# Patient Record
Sex: Female | Born: 1980 | Race: White | Hispanic: No | Marital: Married | Smoking: Never smoker
Health system: Southern US, Community
[De-identification: ages and names within clinical notes are randomized; demographics above are authoritative.]

## PROBLEM LIST (undated history)

## (undated) DIAGNOSIS — K529 Noninfective gastroenteritis and colitis, unspecified: Secondary | ICD-10-CM

## (undated) DIAGNOSIS — F419 Anxiety disorder, unspecified: Secondary | ICD-10-CM

## (undated) DIAGNOSIS — G43909 Migraine, unspecified, not intractable, without status migrainosus: Secondary | ICD-10-CM

## (undated) DIAGNOSIS — H6991 Unspecified Eustachian tube disorder, right ear: Secondary | ICD-10-CM

---

## 2008-03-28 ENCOUNTER — Ambulatory Visit: Payer: Self-pay | Admitting: Internal Medicine

## 2008-03-28 DIAGNOSIS — R05 Cough: Secondary | ICD-10-CM

## 2008-03-29 ENCOUNTER — Telehealth: Payer: Self-pay | Admitting: Internal Medicine

## 2008-04-05 ENCOUNTER — Ambulatory Visit: Payer: Self-pay | Admitting: Internal Medicine

## 2008-04-05 ENCOUNTER — Telehealth (INDEPENDENT_AMBULATORY_CARE_PROVIDER_SITE_OTHER): Payer: Self-pay | Admitting: *Deleted

## 2008-04-06 ENCOUNTER — Telehealth (INDEPENDENT_AMBULATORY_CARE_PROVIDER_SITE_OTHER): Payer: Self-pay | Admitting: *Deleted

## 2008-07-13 ENCOUNTER — Encounter: Payer: Self-pay | Admitting: Adult Health

## 2008-07-13 ENCOUNTER — Ambulatory Visit: Payer: Self-pay | Admitting: Internal Medicine

## 2008-07-28 ENCOUNTER — Ambulatory Visit: Payer: Self-pay | Admitting: Internal Medicine

## 2008-07-28 DIAGNOSIS — R079 Chest pain, unspecified: Secondary | ICD-10-CM

## 2008-08-16 ENCOUNTER — Ambulatory Visit: Payer: Self-pay | Admitting: Internal Medicine

## 2009-04-25 ENCOUNTER — Ambulatory Visit: Payer: Self-pay | Admitting: Otolaryngology

## 2012-03-18 HISTORY — PX: DIAGNOSTIC LAPAROSCOPY: SUR761

## 2012-07-18 ENCOUNTER — Ambulatory Visit: Payer: Self-pay

## 2012-11-05 ENCOUNTER — Ambulatory Visit: Payer: Self-pay | Admitting: Obstetrics and Gynecology

## 2012-11-10 ENCOUNTER — Ambulatory Visit: Payer: Self-pay | Admitting: Obstetrics and Gynecology

## 2012-11-12 LAB — PATHOLOGY REPORT

## 2012-11-13 ENCOUNTER — Emergency Department: Payer: Self-pay | Admitting: Emergency Medicine

## 2012-11-13 LAB — CBC WITH DIFFERENTIAL/PLATELET
Eosinophil #: 0.2 10*3/uL (ref 0.0–0.7)
Eosinophil %: 2 %
HCT: 39.2 % (ref 35.0–47.0)
HGB: 13.4 g/dL (ref 12.0–16.0)
Lymphocyte #: 2.8 10*3/uL (ref 1.0–3.6)
Lymphocyte %: 34.6 %
Monocyte #: 0.6 x10 3/mm (ref 0.2–0.9)
RBC: 4.31 10*6/uL (ref 3.80–5.20)
RDW: 12.7 % (ref 11.5–14.5)

## 2012-11-13 LAB — URINALYSIS, COMPLETE
Bilirubin,UR: NEGATIVE
Ketone: NEGATIVE
Nitrite: NEGATIVE
Ph: 8 (ref 4.5–8.0)
RBC,UR: 1 /HPF (ref 0–5)
WBC UR: 12 /HPF (ref 0–5)

## 2012-11-13 LAB — COMPREHENSIVE METABOLIC PANEL
Alkaline Phosphatase: 59 U/L (ref 50–136)
Anion Gap: 5 — ABNORMAL LOW (ref 7–16)
Calcium, Total: 9 mg/dL (ref 8.5–10.1)
Chloride: 108 mmol/L — ABNORMAL HIGH (ref 98–107)
Co2: 28 mmol/L (ref 21–32)
Creatinine: 1.35 mg/dL — ABNORMAL HIGH (ref 0.60–1.30)
EGFR (African American): >60
EGFR (Non-African Amer.): 53 — ABNORMAL LOW
Osmolality: 280 (ref 275–301)
SGOT(AST): 23 U/L (ref 15–37)
Sodium: 141 mmol/L (ref 136–145)
Total Protein: 7.2 g/dL (ref 6.4–8.2)

## 2014-07-08 NOTE — Op Note (Signed)
PATIENT NAME:  Candice Mccall, HOLLAR MR#:  [id number] DATE OF BIRTH:  1981-01-29  DATE OF PROCEDURE:  11/10/2012  PREOPERATIVE DIAGNOSIS:  Chronic pelvic pain.   POSTOPERATIVE DIAGNOSIS:  Chronic pelvic pain.  OPERATION PERFORMED:  Diagnostic laparoscopy and cystoscopy.    ANESTHESIA USED:  General.   PRIMARY SURGEON:  Stoney Bang. Georgianne Fick, M.D.   ESTIMATED BLOOD LOSS:  Minimal.   COMPLICATIONS:  None.   INTRAOPERATIVE FINDINGS:  Normal cystoscopy findings.  Normal ovaries, tubes, uterus appendix and ureters on diagnostic laparoscopy.  Some hypopigmented lesions in the cul-de-sac which appear to be old, probable endometriosis scarring versus some endometriotic implants involving the right uterosacral ligament.   SPECIMENS REMOVED:  Peritoneal biopsies of the right uterosacral ligaments as well as cul-de-sac.   THE PATIENT CONDITION FOLLOWING PROCEDURE:  Stable.   PROCEDURE IN DETAIL:  Risks, benefits and alternatives of the procedure were discussed with the patient prior to be taken to the Operating Room.  The patient was taken to the Operating Room where she was placed under general anesthesia.  She was positioned in the dorsal lithotomy position using Allen stirrups.  The patient was prepped and draped in the usual sterile fashion.  Attention was turned to the patient's pelvis.  A 30 degree cystoscope was used to visualize the patient's bladder, bladder dome, ureters as well as urethra with no obvious abnormalities noted.  Following this, an operative speculum was placed.  The inferior lip of the cervix was grasped with a single-tooth tenaculum.  A Hulka tenaculum is then placed to allow manipulation of the uterus before removing the single-tooth tenaculum and operative speculum.  Attention was turned to the patient's abdomen.  A stab incision was made at the base of the umbilicus.  The entry into the peritoneal cavity was accomplished using direct visualization and an XL trocar.  Pneumoperitoneum was  then established.  A left 5 mm XL trocar was then placed under direct visualization.  Inspection of the pelvis and the abdomen was grossly normal as described above with the exception of some small hypopigmented scarring in the pelvis as well as one area which looked to be endometriosis implanted on the right uterosacral ligament.  These were biopsied.  Following the biopsies, pneumoperitoneum was evacuated.  The trocars were removed.  The port sites were closed using Dermabond.  Sponge, needle and instrument counts were correct x 2.  The patient tolerated the procedure well, was taken to the recovery room in stable condition.      ____________________________ Stoney Bang. Georgianne Fick, MD ams:ea D: 11/11/2012 22:31:55 ET T: 11/11/2012 23:33:34 ET JOB#: [id number]  cc: Stoney Bang. Georgianne Fick, MD, <Dictator> Dorthula Nettles MD ELECTRONICALLY SIGNED 11/17/2012 22:42

## 2015-02-03 ENCOUNTER — Ambulatory Visit
Admission: EM | Admit: 2015-02-03 | Discharge: 2015-02-03 | Disposition: A | Discharge status: Home / Self Care | Payer: BLUE CROSS/BLUE SHIELD

## 2015-02-03 ENCOUNTER — Inpatient Hospital Stay
Admission: EM | Admit: 2015-02-03 | Discharge: 2015-02-08 | DRG: 392 | Disposition: A | Discharge status: Home / Self Care | Payer: BLUE CROSS/BLUE SHIELD | Attending: Internal Medicine | Admitting: Internal Medicine

## 2015-02-03 ENCOUNTER — Encounter: Payer: Self-pay | Admitting: Emergency Medicine

## 2015-02-03 ENCOUNTER — Emergency Department: Payer: BLUE CROSS/BLUE SHIELD

## 2015-02-03 DIAGNOSIS — N941 Unspecified dyspareunia: Secondary | ICD-10-CM | POA: Diagnosis present

## 2015-02-03 DIAGNOSIS — K529 Noninfective gastroenteritis and colitis, unspecified: Secondary | ICD-10-CM | POA: Diagnosis not present

## 2015-02-03 DIAGNOSIS — Z91011 Allergy to milk products: Secondary | ICD-10-CM

## 2015-02-03 DIAGNOSIS — F419 Anxiety disorder, unspecified: Secondary | ICD-10-CM | POA: Diagnosis present

## 2015-02-03 DIAGNOSIS — Z882 Allergy status to sulfonamides status: Secondary | ICD-10-CM

## 2015-02-03 DIAGNOSIS — R630 Anorexia: Secondary | ICD-10-CM | POA: Diagnosis present

## 2015-02-03 DIAGNOSIS — R1031 Right lower quadrant pain: Secondary | ICD-10-CM

## 2015-02-03 DIAGNOSIS — N898 Other specified noninflammatory disorders of vagina: Secondary | ICD-10-CM | POA: Diagnosis present

## 2015-02-03 DIAGNOSIS — Z880 Allergy status to penicillin: Secondary | ICD-10-CM

## 2015-02-03 DIAGNOSIS — K469 Unspecified abdominal hernia without obstruction or gangrene: Secondary | ICD-10-CM | POA: Diagnosis present

## 2015-02-03 DIAGNOSIS — F329 Major depressive disorder, single episode, unspecified: Secondary | ICD-10-CM | POA: Diagnosis present

## 2015-02-03 HISTORY — DX: Noninfective gastroenteritis and colitis, unspecified: K52.9

## 2015-02-03 LAB — URINALYSIS COMPLETE WITH MICROSCOPIC (ARMC ONLY)
BILIRUBIN URINE: NEGATIVE
Bacteria, UA: NONE SEEN
Glucose, UA: NEGATIVE mg/dL
Hgb urine dipstick: NEGATIVE
KETONES UR: NEGATIVE mg/dL
Leukocytes, UA: NEGATIVE
NITRITE: NEGATIVE
PROTEIN: NEGATIVE mg/dL
SPECIFIC GRAVITY, URINE: 1.003 — AB (ref 1.005–1.030)
pH: 7 (ref 5.0–8.0)

## 2015-02-03 LAB — WET PREP, GENITAL
CLUE CELLS WET PREP: NONE SEEN
Sperm: NONE SEEN
Trich, Wet Prep: NONE SEEN
WBC WET PREP: NONE SEEN
YEAST WET PREP: NONE SEEN

## 2015-02-03 LAB — COMPREHENSIVE METABOLIC PANEL
ALK PHOS: 65 U/L (ref 38–126)
ALT: 16 U/L (ref 14–54)
ANION GAP: 8 (ref 5–15)
AST: 21 U/L (ref 15–41)
Albumin: 4.9 g/dL (ref 3.5–5.0)
BILIRUBIN TOTAL: 0.7 mg/dL (ref 0.3–1.2)
BUN: 7 mg/dL (ref 6–20)
CALCIUM: 9.4 mg/dL (ref 8.9–10.3)
CO2: 27 mmol/L (ref 22–32)
CREATININE: 0.72 mg/dL (ref 0.44–1.00)
Chloride: 105 mmol/L (ref 101–111)
GFR calc non Af Amer: >60 mL/min (ref >60)
Glucose, Bld: 105 mg/dL — ABNORMAL HIGH (ref 65–99)
Potassium: 4.1 mmol/L (ref 3.5–5.1)
SODIUM: 140 mmol/L (ref 135–145)
TOTAL PROTEIN: 7.6 g/dL (ref 6.5–8.1)

## 2015-02-03 LAB — CBC
HCT: 41 % (ref 35.0–47.0)
HEMOGLOBIN: 13.5 g/dL (ref 12.0–16.0)
MCH: 30.1 pg (ref 26.0–34.0)
MCHC: 33 g/dL (ref 32.0–36.0)
MCV: 91.1 fL (ref 80.0–100.0)
PLATELETS: 309 10*3/uL (ref 150–440)
RBC: 4.5 MIL/uL (ref 3.80–5.20)
RDW: 13.5 % (ref 11.5–14.5)
WBC: 11.1 10*3/uL — AB (ref 3.6–11.0)

## 2015-02-03 LAB — POCT PREGNANCY, URINE: Preg Test, Ur: NEGATIVE

## 2015-02-03 MED ORDER — ONDANSETRON HCL 4 MG/2ML IJ SOLN
4.0000 mg | Freq: Once | INTRAMUSCULAR | Status: AC
  Administered 2015-02-03: 4 mg via INTRAVENOUS
  Filled 2015-02-03: qty 2

## 2015-02-03 MED ORDER — IOHEXOL 240 MG/ML SOLN: 25.0000 mL | Freq: Once | INTRAMUSCULAR | Status: DC | PRN

## 2015-02-03 MED ORDER — MORPHINE SULFATE (PF) 4 MG/ML IV SOLN
4.0000 mg | Freq: Once | INTRAVENOUS | Status: AC
  Administered 2015-02-03: 4 mg via INTRAVENOUS
  Filled 2015-02-03: qty 1

## 2015-02-03 MED ORDER — ONDANSETRON HCL 4 MG/2ML IJ SOLN
4.0000 mg | Freq: Once | INTRAMUSCULAR | Status: AC | PRN
  Administered 2015-02-03 (×2): 4 mg via INTRAVENOUS

## 2015-02-03 MED ORDER — ONDANSETRON HCL 4 MG/2ML IJ SOLN
INTRAMUSCULAR | Status: AC
  Administered 2015-02-03: 4 mg via INTRAVENOUS
  Filled 2015-02-03: qty 2

## 2015-02-03 MED ORDER — IOHEXOL 240 MG/ML SOLN
25.0000 mL | Freq: Once | INTRAMUSCULAR | Status: AC | PRN
  Administered 2015-02-03: 25 mL via ORAL

## 2015-02-03 MED ORDER — SODIUM CHLORIDE 0.9 % IV BOLUS (SEPSIS)
1000.0000 mL | Freq: Once | INTRAVENOUS | Status: AC
  Administered 2015-02-03: 1000 mL via INTRAVENOUS

## 2015-02-03 MED ORDER — IOHEXOL 300 MG/ML  SOLN
75.0000 mL | Freq: Once | INTRAMUSCULAR | Status: AC | PRN
  Administered 2015-02-03: 75 mL via INTRAVENOUS

## 2015-02-03 NOTE — ED Notes (Signed)
Pt states "they are suspicious i have chron's" pt states has had rlq pain that is getting worse with black stools for "weeks". pts tates was seen at [place] yesterday, received oxycodone which is not helping pain. Last dose oxy at 2000 last pm.

## 2015-02-03 NOTE — ED Provider Notes (Signed)
Briefly talked to patient in triage. Patient states that she has had some right lower abdominal pain for over a week did have a CT scan at that time which showed possible etiology related to Crohn's. She was given medication however her symptoms have worsened. She denies any fever however has noticed some changes in her stool and some difficulty urinating. She is in the office today holding the right side of her abdomen. She is awake and alert and oriented. She is here with a family member. I've asked that the patient go to the ER immediately for further evaluation and treatment and possible re-imaging with CT. Patient is stable at this time to travel with family member to [facility] now.   Paulina Fusi, MD 02/03/15 1902

## 2015-02-03 NOTE — ED Notes (Signed)
Pt ambualted to bathroom at this time with no concerns. Pt tolerated well, no acute distress noted at this time.

## 2015-02-03 NOTE — ED Notes (Signed)
Pelvic cart at bedside, set up at this time, MD Lord made aware. Will let RN know when ready.

## 2015-02-03 NOTE — ED Notes (Addendum)
Pt states that she has lower right abdominal pain for about 1 week, pt states that she had a CT scan recently.  Pt states that she is having difficulty urinating and has black tarry stools. Dr Posey Pronto advised pt to be seen at ED by personal vehicle, Notified charge nurse [place] at [facility].

## 2015-02-03 NOTE — ED Provider Notes (Signed)
[facility] Emergency Department Provider Note   ____________________________________________  Time seen:  I have reviewed the triage vital signs and the triage nursing note.  HISTORY  Chief Complaint Abdominal Pain   Historian Patient  HPI Candice Mccall is a 34 y.o. female who is here for evaluation of abdominal pain which is worse in the right lower quadrant and moderate to severe. Pain started about 1 week ago and has just been getting worse. She has had multiple episodes of intermittent abdominal pain including right lower quadrant pain in the past and is presumed to have possible Crohn's disease. Several months ago she had a colonoscopy that showed ulcers and she has a follow-up with a GI doctor in December. 2 weeks ago with her primary care physician she had a CT scan which showed inflammation of the ileum and she was started on prednisone for presumptive Crohn's disease flare. Patient states she was improved and then about 1 week ago she had new/worsening pain.  She was seen at [facility] ED yesterday where she had blood work that showed an elevated white blood cell count, but no imaging, and was sent home on oxycodone. Patient states that she has had continued worsening/severe lower and right lower quadrant abdominal pain. She has had small amount of clear vaginal discharge. She does have dyspareunia. A year or so ago she was told she had endometriosis, but she hasn't had reevaluation for that since the birth of her child.  She thought she had a fever a couple days ago.    History reviewed. No pertinent past medical history.intermittent recurrent abdominal pain, possible Crohn's disease  Patient Active Problem List   Diagnosis Date Noted  . CHEST PAIN 07/28/2008  . COUGH 03/28/2008    History reviewed. No pertinent past surgical history.  No current outpatient prescriptions on file.prednisone, oxycodone  Allergies Penicillins and Sulfa antibiotics  No  family history on file.  Social History Social History  Substance Use Topics  . Smoking status: Never Smoker   . Smokeless tobacco: Never Used  . Alcohol Use: No    Review of Systems  Constitutional: positive for fever Eyes: Negative for visual changes. ENT: Negative for sore throat. Cardiovascular: Negative for chest pain. Respiratory: Negative for shortness of breath. Gastrointestinal: previously multiple diarrhea episodes per day, now to vomit as per week. They have been dark. Positive for nausea no vomiting. Genitourinary: Negative for dysuria. Musculoskeletal: Negative for back pain. Skin: Negative for rash. Neurological: Negative for headache. 10 point Review of Systems otherwise negative ____________________________________________   PHYSICAL EXAM:  VITAL SIGNS: ED Triage Vitals  Enc Vitals Group     BP 02/03/15 1936 122/74 mmHg     Pulse Rate 02/03/15 1936 72     Resp 02/03/15 1936 20     Temp 02/03/15 1936 98.2 F (36.8 C)     Temp Source 02/03/15 1936 Oral     SpO2 02/03/15 1936 98 %     Weight 02/03/15 1936 124 lb (56.246 kg)     Height 02/03/15 1936 5\' 4"  (1.626 m)     Head Cir --      Peak Flow --      Pain Score 02/03/15 1936 9     Pain Loc --      Pain Edu? --      Excl. in [place]? --      Constitutional: Alert and oriented. Well appearing and in no distress. Eyes: Conjunctivae are normal. PERRL. Normal extraocular movements. ENT  Head: Normocephalic and atraumatic.   Nose: No congestion/rhinnorhea.   Mouth/Throat: Mucous membranes are moist.   Neck: No stridor. Cardiovascular/Chest: Normal rate, regular rhythm.  No murmurs, rubs, or gallops. Respiratory: Normal respiratory effort without tachypnea nor retractions. Breath sounds are clear and equal bilaterally. No wheezes/rales/rhonchi. Gastrointestinal: Soft. No distention.  Moderate tenderness mid abdomen and right lower quadrant and suprapubic. Mild guarding, no rebound.   Genitourinary/rectal:small amount of white discharge, no adnexal mass or cervical motion tenderness Musculoskeletal: Nontender with normal range of motion in all extremities. No joint effusions.  No lower extremity tenderness.  No edema. Neurologic:  Normal speech and language. No gross or focal neurologic deficits are appreciated. Skin:  Skin is warm, dry and intact. No rash noted. Psychiatric: Mood and affect are normal. Speech and behavior are normal. Patient exhibits appropriate insight and judgment.  ____________________________________________   EKG I, Lisa Roca, MD, the attending physician have personally viewed and interpreted all ECGs.  No EKG performed ____________________________________________  LABS (pertinent positives/negatives)  Urine pregnancy test negative Urinalysis negative Comprehensive metabolic panel without significant abnormality White blood count 11.1, hemoglobin 13.5 and platelet count 309 Wet prep negative ____________________________________________  RADIOLOGY All Xrays were viewed by me. Imaging interpreted by Radiologist.  CT abdomen and pelvis with contrast:   FINDINGS: Lower chest: No acute findings.  Hepatobiliary: No masses or other significant abnormality. Gallbladder is unremarkable.  Pancreas: No mass, inflammatory changes, or other significant abnormality.  Spleen: Within normal limits in size and appearance.  Adrenals/Urinary Tract: No masses identified. No evidence of hydronephrosis.  Stomach/Bowel: Moderate stool noted in ascending and transverse portions of colon. A nondilated loop of small bowel is seen within the right lower quadrant which shows central mesenteric soft tissue stranding on image 50/series 2. This raises suspicion for an internal hernia. No evidence of bowel obstruction, ischemia, or other complication.  Vascular/Lymphatic: No pathologically enlarged lymph nodes. No evidence of abdominal aortic  aneurysm.  Reproductive: No mass or other significant abnormality.  Other: None.  Musculoskeletal: No suspicious bone lesions identified.  IMPRESSION: Possible internal small bowel hernia in the right lower quadrant. No evidence of bowel obstruction, ischemia, or other acute findings. __________________________________________  PROCEDURES  Procedure(s) performed: None  Critical Care performed: None  ____________________________________________   ED COURSE / ASSESSMENT AND PLAN  CONSULTATIONS: phone consultation with Dr. Adonis Huguenin, surgery.  Phone consultation with GI Dr. Vira Agar.  Hospitalist for admission.  Pertinent labs & imaging results that were available during my care of the patient were reviewed by me and considered in my medical decision making (see chart for details).  I discussed with the patient that although it is possible this is another episode related to possible Crohn's, given that she was improved/better and then had a new episode which is much worse especially right lower quadrant, I do think it is necessary to pursue CT scan abdomen to rule out appendicitis or other emergencies such as abscess or perforation given the history of fever, worsening abdominal pain, and elevated white blood cell count. We discussed risks versus benefit, and in share decision making chest to proceed.  Radiologist commented possible internal hernia on CT scan. I consulted and discussed the case with surgeon on-call, Dr. Adonis Huguenin, and he does not feel that the findings are reflective of internal small bowel hernia, but that the ileum wall is thickened consistent more with a process such as Crohn's.  Patient has failed outpatient therapy, with persistent severe abdominal pain despite prednisone 1 week and oxycodone at  home. Patient will need admission for treatment and workup.  Dr. Vira Agar recommends cipro, flagyl and continue steroids (prednisone 60mg  daily).  Patient / Family /  Caregiver informed of clinical course, medical decision-making process, and agree with plan.    ___________________________________________   FINAL CLINICAL IMPRESSION(S) / ED DIAGNOSES   Final diagnoses:  RLQ abdominal pain  Colitis       Lisa Roca, MD 02/04/15 [phone]

## 2015-02-04 ENCOUNTER — Encounter: Payer: Self-pay | Admitting: Internal Medicine

## 2015-02-04 DIAGNOSIS — N898 Other specified noninflammatory disorders of vagina: Secondary | ICD-10-CM | POA: Diagnosis present

## 2015-02-04 DIAGNOSIS — K529 Noninfective gastroenteritis and colitis, unspecified: Secondary | ICD-10-CM | POA: Diagnosis present

## 2015-02-04 DIAGNOSIS — N941 Unspecified dyspareunia: Secondary | ICD-10-CM | POA: Diagnosis present

## 2015-02-04 DIAGNOSIS — Z91011 Allergy to milk products: Secondary | ICD-10-CM | POA: Diagnosis not present

## 2015-02-04 DIAGNOSIS — Z882 Allergy status to sulfonamides status: Secondary | ICD-10-CM | POA: Diagnosis not present

## 2015-02-04 DIAGNOSIS — K469 Unspecified abdominal hernia without obstruction or gangrene: Secondary | ICD-10-CM | POA: Diagnosis present

## 2015-02-04 DIAGNOSIS — R630 Anorexia: Secondary | ICD-10-CM | POA: Diagnosis present

## 2015-02-04 DIAGNOSIS — F419 Anxiety disorder, unspecified: Secondary | ICD-10-CM | POA: Diagnosis present

## 2015-02-04 DIAGNOSIS — F329 Major depressive disorder, single episode, unspecified: Secondary | ICD-10-CM | POA: Diagnosis present

## 2015-02-04 DIAGNOSIS — Z880 Allergy status to penicillin: Secondary | ICD-10-CM | POA: Diagnosis not present

## 2015-02-04 LAB — CHLAMYDIA/NGC RT PCR (ARMC ONLY)
CHLAMYDIA TR: NOT DETECTED
N GONORRHOEAE: NOT DETECTED

## 2015-02-04 MED ORDER — MORPHINE SULFATE (PF) 2 MG/ML IV SOLN
2.0000 mg | INTRAVENOUS | Status: DC | PRN
  Administered 2015-02-04 – 2015-02-08 (×17): 2 mg via INTRAVENOUS
  Filled 2015-02-04 (×18): qty 1

## 2015-02-04 MED ORDER — ONDANSETRON HCL 4 MG PO TABS
4.0000 mg | ORAL_TABLET | Freq: Four times a day (QID) | ORAL | Status: DC | PRN
  Administered 2015-02-08: 08:00:00 4 mg via ORAL
  Filled 2015-02-04: qty 1

## 2015-02-04 MED ORDER — HEPARIN SODIUM (PORCINE) 5000 UNIT/ML IJ SOLN: 5000.0000 [IU] | Freq: Three times a day (TID) | INTRAMUSCULAR | Status: DC

## 2015-02-04 MED ORDER — MORPHINE SULFATE (PF) 4 MG/ML IV SOLN
4.0000 mg | INTRAVENOUS | Status: DC | PRN
Start: 2015-02-04 — End: 2015-02-04
  Administered 2015-02-04 (×3): 4 mg via INTRAVENOUS
  Filled 2015-02-04 (×3): qty 1

## 2015-02-04 MED ORDER — METRONIDAZOLE IN NACL 5-0.79 MG/ML-% IV SOLN
500.0000 mg | Freq: Three times a day (TID) | INTRAVENOUS | Status: DC
  Administered 2015-02-04 – 2015-02-07 (×10): 500 mg via INTRAVENOUS
  Filled 2015-02-04 (×15): qty 100

## 2015-02-04 MED ORDER — ONDANSETRON HCL 4 MG/2ML IJ SOLN
4.0000 mg | Freq: Four times a day (QID) | INTRAMUSCULAR | Status: DC | PRN
  Administered 2015-02-05 – 2015-02-08 (×6): 4 mg via INTRAVENOUS
  Filled 2015-02-04 (×6): qty 2

## 2015-02-04 MED ORDER — PREDNISONE 10 MG PO TABS
60.0000 mg | ORAL_TABLET | Freq: Every day | ORAL | Status: DC
  Administered 2015-02-05 – 2015-02-06 (×2): 60 mg via ORAL
  Filled 2015-02-04 (×2): qty 1

## 2015-02-04 MED ORDER — DIPHENHYDRAMINE HCL 25 MG PO CAPS
25.0000 mg | ORAL_CAPSULE | ORAL | Status: DC | PRN
  Administered 2015-02-04: 23:00:00 25 mg via ORAL
  Filled 2015-02-04: qty 1

## 2015-02-04 MED ORDER — CIPROFLOXACIN IN D5W 400 MG/200ML IV SOLN
400.0000 mg | Freq: Two times a day (BID) | INTRAVENOUS | Status: DC
  Administered 2015-02-04 – 2015-02-07 (×6): 400 mg via INTRAVENOUS
  Filled 2015-02-04 (×8): qty 200

## 2015-02-04 MED ORDER — CIPROFLOXACIN IN D5W 200 MG/100ML IV SOLN: 200.0000 mg | Freq: Two times a day (BID) | INTRAVENOUS | Status: DC

## 2015-02-04 MED ORDER — METRONIDAZOLE IN NACL 5-0.79 MG/ML-% IV SOLN
500.0000 mg | Freq: Once | INTRAVENOUS | Status: AC
  Administered 2015-02-04: 500 mg via INTRAVENOUS
  Filled 2015-02-04: qty 100

## 2015-02-04 MED ORDER — MORPHINE SULFATE (PF) 2 MG/ML IV SOLN: 2.0000 mg | INTRAVENOUS | Status: DC | PRN

## 2015-02-04 MED ORDER — ACETAMINOPHEN 325 MG PO TABS: 650.0000 mg | ORAL_TABLET | Freq: Four times a day (QID) | ORAL | Status: DC | PRN

## 2015-02-04 MED ORDER — PANTOPRAZOLE SODIUM 40 MG IV SOLR
40.0000 mg | Freq: Once | INTRAVENOUS | Status: AC
  Administered 2015-02-04: 40 mg via INTRAVENOUS
  Filled 2015-02-04: qty 40

## 2015-02-04 MED ORDER — DULOXETINE HCL 30 MG PO CPEP
30.0000 mg | ORAL_CAPSULE | Freq: Every day | ORAL | Status: DC
  Administered 2015-02-04 – 2015-02-08 (×5): 30 mg via ORAL
  Filled 2015-02-04 (×5): qty 1

## 2015-02-04 MED ORDER — SODIUM CHLORIDE 0.9 % IV SOLN
INTRAVENOUS | Status: DC
  Administered 2015-02-04 – 2015-02-08 (×6): via INTRAVENOUS

## 2015-02-04 MED ORDER — OXYCODONE HCL 5 MG PO TABS
5.0000 mg | ORAL_TABLET | ORAL | Status: DC | PRN
  Filled 2015-02-04 (×2): qty 1

## 2015-02-04 MED ORDER — CIPROFLOXACIN IN D5W 400 MG/200ML IV SOLN
400.0000 mg | Freq: Once | INTRAVENOUS | Status: AC
  Administered 2015-02-04: 400 mg via INTRAVENOUS
  Filled 2015-02-04: qty 200

## 2015-02-04 MED ORDER — ACETAMINOPHEN 650 MG RE SUPP: 650.0000 mg | Freq: Four times a day (QID) | RECTAL | Status: DC | PRN

## 2015-02-04 MED ORDER — METHYLPREDNISOLONE SODIUM SUCC 125 MG IJ SOLR
60.0000 mg | Freq: Once | INTRAMUSCULAR | Status: AC
  Administered 2015-02-04: 60 mg via INTRAVENOUS
  Filled 2015-02-04 (×2): qty 2

## 2015-02-04 NOTE — Consult Note (Signed)
Pt with hx of RLQ abd pain with several studies and multiple visits for this over the last 3 years.  She has not had a capsule endoscopy and we will try to arrange that for Monday, if not then can be done as an out patient.

## 2015-02-04 NOTE — Consult Note (Signed)
GI Inpatient Consult Note  Reason for Consult: Colitis   Attending Requesting Consult: Dr. Lavetta Nielsen  History of Present Illness: Candice Mccall is a 34 y.o. female who reports that she has had abdominal pain and diarrhea for several years.  She reports she will have 3-10 episodes a day, and then will occasionally have a week of constipation, followed by diarrhea episodes again.  She reports her abdominal pain as right lower quadrant, describes it as"scraping, clawing" pain that is constant, worse with movement, morphine does help the pain a little.  She also reports that she will have fevers on and off.  She has seen her primary care in [facility] for these issues for many years.  She was recently sent to [place] clinic for further evaluation.  During that visit calprotectin which was negative, C diff  was negative and pancreatic elastase  was 198.  Plan included luminal evaluation.  Her last colonoscopy in was December 14, 2014 with Dr. Mont Dutton  from [place].  Indication was clinically significant diarrhea of unexplained origin, left-sided abdominal pain, question of dark stool.  Findings ; a few small ulcers in the terminal ileum, possibly related to prep effect, internal hemorrhoids, the entire examined colon was otherwise normal.  Pathology ; random colon biopsy, colonic mucosa with no significant pathologic diagnosis.  No  Evidence of lymphocytic or collagenous colitis.  Terminal ileum biopsy, ileal mucosa with no significant pathologic diagnosis.  Her last upper endoscopy was on the same day For indication of diarrhea left-sided abdominal pain and question of dark stool.  Impression; normal esophagus, normal stomach, normal examined duodenum.  Biopsied to rule out celiacs.  Pathology duodenum biopsy ; duodenal mucosa with no significant pathologic diagnosis.  She reports she went back to her primary care a little over week ago with the same abdominal pain, she reports a  CT of the abdomen was done and showed inflammation in the terminal ileum, she was put on 20 mg of prednisone, 5 mg 4 times a day.  She reports at that time she was not having diarrhea episodes and her primary care told her to take 2 days of steroids and then take laxatives.  She reports a laxatives caused her great pain, she has not taken any more and she reports her bowel movements now all her just sticky black.  She reports her nausea is a little better with Zofran.   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  Unable to locate these records.  She reports she has had elevated ANA previously, but has never been consistant. She has a significant history of mother with RA, grandfather with psoriasis.  She also has a history of being seen at [place] [facility], from approximately 2014 until July of this year for the same complaints.  During that time on review of medical records she did have another colonoscopy in 2014 with negative findings.  The last note from them and that they recommended a referral to food allergy testing and pelvic PT, that no other GI interventions were necessary.  She did not mention this history during our interview.  She was seen at [facility] ED  2 days ago the gave her pain medication and sent her home.  Past Medical History:  Past Medical History  Diagnosis Date  . Colitis     Problem List:  Patient Active Problem List   Diagnosis Date Noted  . Colitis 02/04/2015    Past Surgical History: History reviewed. No pertinent past surgical history.  Allergies: Allergies  Allergen Reactions  . Penicillins Hives and Other (See Comments)    Has patient had a PCN reaction causing immediate rash, facial/tongue/throat swelling, SOB or lightheadedness with hypotension: No Has patient had a PCN reaction causing severe rash involving mucus membranes or skin necrosis: No Has  patient had a PCN reaction that required hospitalization No Has patient had a PCN reaction occurring within the last 10 years: No If all of the above answers are "NO", then may proceed with Cephalosporin use.   . Sulfa Antibiotics Rash and Other (See Comments)    Joint pain    Home Medications: Prescriptions prior to admission  Medication Sig Dispense Refill Last Dose  . DULoxetine (CYMBALTA) 30 MG capsule Take 30 mg by mouth daily.   02/03/2015 at Unknown time  . predniSONE (DELTASONE) 20 MG tablet Take 20 mg by mouth as directed. Take 20mg  for one more week, then taper dose down   02/03/2015 at Unknown time   Home medication reconciliation was completed with the patient.   Scheduled Inpatient Medications:   . ciprofloxacin  400 mg Intravenous Q12H  . DULoxetine  30 mg Oral Daily  . metronidazole  500 mg Intravenous Q8H  . [START ON 02/05/2015] predniSONE  60 mg Oral Q breakfast    Continuous Inpatient Infusions:   . sodium chloride 75 mL/hr at 02/04/15 0245    PRN Inpatient Medications:  acetaminophen **OR** acetaminophen, morphine injection, ondansetron **OR** ondansetron (ZOFRAN) IV, oxyCODONE  Family History: family history is negative for Diabetes.    Social History:   reports that she has never smoked. She has never used smokeless tobacco. She reports that she does not drink alcohol or use illicit drugs.   Review of Systems: Constitutional: Weight is stable.  Eyes: No changes in vision. ENT: No oral lesions, sore throat.  GI: see HPI.  Heme/Lymph: No easy bruising.  CV: No chest pain.  GU: No hematuria.  Integumentary: No rashes.  Neuro: No headaches.  Psych: No depression/anxiety.  Endocrine: No heat/cold intolerance.  Allergic/Immunologic: No urticaria.  Resp: No cough, SOB.  Musculoskeletal: No joint swelling.    Physical Examination: BP 104/57 mmHg  Pulse 52  Temp(Src) 97.8 F (36.6 C) (Oral)  Resp 18  Ht 5\' 4"  (1.626 m)  Wt 57.289 kg (126 lb 4.8  oz)  BMI 21.67 kg/m2  SpO2 99%  LMP 01/15/2015 Gen: NAD, alert and oriented x 4 HEENT: PEERLA, EOMI, Neck: supple, no JVD or thyromegaly Chest: CTA bilaterally, no wheezes, crackles, or other adventitious sounds CV: RRR, no m/g/c/r Abd: soft, rlq tenderness, ND, +BS in all four quadrants; no HSM, guarding, ridigity, or rebound tenderness Ext: no edema, well perfused with 2+ pulses, Skin: no rash or lesions noted Lymph: no LAD  Data: Lab Results  Component Value Date   WBC 11.1* 02/03/2015   HGB 13.5 02/03/2015   HCT 41.0 02/03/2015   MCV 91.1 02/03/2015   PLT 309 02/03/2015    Recent Labs Lab 02/03/15 1946  HGB 13.5   Lab Results  Component Value Date   NA 140 02/03/2015   K 4.1 02/03/2015   CL 105 02/03/2015   CO2 27 02/03/2015   BUN 7 02/03/2015   CREATININE 0.72 02/03/2015   Lab Results  Component Value Date   ALT 16 02/03/2015   AST 21 02/03/2015  ALKPHOS 65 02/03/2015   BILITOT 0.7 02/03/2015   No results for input(s): APTT, INR, PTT in the last 168 hours.  Imaging: CLINICAL DATA: Worsening right lower quadrant pain and melena for several weeks.  EXAM: CT ABDOMEN AND PELVIS WITH CONTRAST  TECHNIQUE: Multidetector CT imaging of the abdomen and pelvis was performed using the standard protocol following bolus administration of intravenous contrast.  CONTRAST: 16mL OMNIPAQUE IOHEXOL 300 MG/ML SOLN  COMPARISON: None.  FINDINGS: Lower chest: No acute findings.  Hepatobiliary: No masses or other significant abnormality. Gallbladder is unremarkable.  Pancreas: No mass, inflammatory changes, or other significant abnormality.  Spleen: Within normal limits in size and appearance.  Adrenals/Urinary Tract: No masses identified. No evidence of hydronephrosis.  Stomach/Bowel: Moderate stool noted in ascending and transverse portions of colon. A nondilated loop of small bowel is seen within the right lower quadrant which shows  central mesenteric soft tissue stranding on image 50/series 2. This raises suspicion for an internal hernia. No evidence of bowel obstruction, ischemia, or other complication.  Vascular/Lymphatic: No pathologically enlarged lymph nodes. No evidence of abdominal aortic aneurysm.  Reproductive: No mass or other significant abnormality.  Other: None.  Musculoskeletal: No suspicious bone lesions identified.  IMPRESSION: Possible internal small bowel hernia in the right lower quadrant. No evidence of bowel obstruction, ischemia, or other acute findings.   Electronically Signed  By: Earle Gell M.D.  On: 02/03/2015 23:12   Assessment/Plan: Ms. Catoe is a 34 y.o. female with elevated WBC of 11.1,being treated with Cipro and Flagyl, has had one week of 20 mg a day Prednisone prior to admission, currently on 60 mg Prednisone  Recommendations: We recommend that she have a capsule endoscopy completed on Monday for further evaluation.  We agree with continuing to treat with Cipro and Flagyl and Prednisone. We also recommend checking CRP and SED rate along with other labs in the morning.  We will continue to follow with you. Thank you for the consult. Please call with questions or concerns.  Salvadore Farber, PA-C  I personally performed these services.

## 2015-02-04 NOTE — H&P (Signed)
[facility] [place] NAME: Candice Mccall    MR#:  [id number]  DATE OF BIRTH:  03-30-1980   DATE OF ADMISSION:  02/03/2015  PRIMARY CARE PHYSICIAN: Pcp Not In System   REQUESTING/REFERRING PHYSICIAN: Lord  CHIEF COMPLAINT:   Chief Complaint  Patient presents with  . Abdominal Pain    HISTORY OF PRESENT ILLNESS:  Candice Mccall  is a 34 y.o. female with a known history of colitis, unspecified who is presenting with abdominal pain. She states she has been dealing with symptoms of diarrhea alternating with constipation for some time approximately 2 months ago had a colonoscopy with findings concerning for Crohn's this was performed at [facility], followed by CAT scan findings consistent with thickening of the ileum. She also complains of right lower quadrant pain intermittent over aggressively worsening in both duration and intensity, currently described as "pain" intensity 7/10 no worsening or relieving factors nonradiating. Associated dark tarry stool 2, subjective chills but no fever  PAST MEDICAL HISTORY:   Past Medical History  Diagnosis Date  . Colitis     PAST SURGICAL HISTORY:  History reviewed. No pertinent past surgical history.  SOCIAL HISTORY:   Social History  Substance Use Topics  . Smoking status: Never Smoker   . Smokeless tobacco: Never Used  . Alcohol Use: No    FAMILY HISTORY:   Family History  Problem Relation Age of Onset  . Diabetes Neg Hx     DRUG ALLERGIES:   Allergies  Allergen Reactions  . Penicillins Hives and Other (See Comments)    Has patient had a PCN reaction causing immediate rash, facial/tongue/throat swelling, SOB or lightheadedness with hypotension: No Has patient had a PCN reaction causing severe rash involving mucus membranes or skin necrosis: No Has patient had a PCN reaction that required hospitalization No Has patient had a PCN reaction occurring within the last 10  years: No If all of the above answers are "NO", then may proceed with Cephalosporin use.   . Sulfa Antibiotics Rash and Other (See Comments)    Joint pain    REVIEW OF SYSTEMS:  REVIEW OF SYSTEMS:  CONSTITUTIONAL: Denies fevers, positive chills, fatigue, weakness.  EYES: Denies blurred vision, double vision, or eye pain.  EARS, NOSE, THROAT: Denies tinnitus, ear pain, hearing loss.  RESPIRATORY: denies cough, shortness of breath, wheezing  CARDIOVASCULAR: Denies chest pain, palpitations, edema.  GASTROINTESTINAL: Positive nausea, denies vomiting, diarrhea, positive abdominal pain.  GENITOURINARY: Denies dysuria, hematuria.  ENDOCRINE: Denies nocturia or thyroid problems. HEMATOLOGIC AND LYMPHATIC: Denies easy bruising or bleeding.  SKIN: Denies rash or lesions.  MUSCULOSKELETAL: Denies pain in neck, back, shoulder, knees, hips, or further arthritic symptoms.  NEUROLOGIC: Denies paralysis, paresthesias.  PSYCHIATRIC: Denies anxiety or depressive symptoms. Otherwise full review of systems performed by me is negative.   MEDICATIONS AT HOME:   Prior to Admission medications   Medication Sig Start Date End Date Taking? Authorizing Provider  DULoxetine (CYMBALTA) 30 MG capsule Take 30 mg by mouth daily.   Yes Historical Provider, MD  predniSONE (DELTASONE) 20 MG tablet Take 20 mg by mouth as directed. Take 20mg  for one more week, then taper dose down   Yes Historical Provider, MD      VITAL SIGNS:  Blood pressure 114/76, pulse 51, temperature 98.2 F (36.8 C), temperature source Oral, resp. rate 18, height 5\' 4"  (1.626 m), weight 124 lb (56.246 kg), last menstrual period 01/15/2015, SpO2 96 %.  PHYSICAL EXAMINATION:  VITAL SIGNS: Filed Vitals:   02/04/15 0007  BP: 114/76  Pulse: 51  Temp:   Resp: 18   GENERAL:34 y.o.female currently in no acute distress.  HEAD: Normocephalic, atraumatic.  EYES: Pupils equal, round, reactive to light. Extraocular muscles intact. No scleral  icterus.  MOUTH: Moist mucosal membrane. Dentition intact. No abscess noted.  EAR, NOSE, THROAT: Clear without exudates. No external lesions.  NECK: Supple. No thyromegaly. No nodules. No JVD.  PULMONARY: Clear to ascultation, without wheeze rails or rhonci. No use of accessory muscles, Good respiratory effort. good air entry bilaterally CHEST: Nontender to palpation.  CARDIOVASCULAR: S1 and S2. Regular rate and rhythm. No murmurs, rubs, or gallops. No edema. Pedal pulses 2+ bilaterally.  GASTROINTESTINAL: Soft, minimal tenderness palpation right lower quadrant without rebound or guarding, nondistended. No masses. Positive bowel sounds. No hepatosplenomegaly.  MUSCULOSKELETAL: No swelling, clubbing, or edema. Range of motion full in all extremities.  NEUROLOGIC: Cranial nerves II through XII are intact. No gross focal neurological deficits. Sensation intact. Reflexes intact.  SKIN: No ulceration, lesions, rashes, or cyanosis. Skin warm and dry. Turgor intact.  PSYCHIATRIC: Mood, affect within normal limits. The patient is awake, alert and oriented x 3. Insight, judgment intact.    LABORATORY PANEL:   CBC  Recent Labs Lab 02/03/15 1946  WBC 11.1*  HGB 13.5  HCT 41.0  PLT 309   ------------------------------------------------------------------------------------------------------------------  Chemistries   Recent Labs Lab 02/03/15 1946  NA 140  K 4.1  CL 105  CO2 27  GLUCOSE 105*  BUN 7  CREATININE 0.72  CALCIUM 9.4  AST 21  ALT 16  ALKPHOS 65  BILITOT 0.7   ------------------------------------------------------------------------------------------------------------------  Cardiac Enzymes No results for input(s): TROPONINI in the last 168 hours. ------------------------------------------------------------------------------------------------------------------  RADIOLOGY:  Ct Abdomen Pelvis W Contrast  02/03/2015  CLINICAL DATA:  Worsening right lower quadrant pain  and melena for several weeks. EXAM: CT ABDOMEN AND PELVIS WITH CONTRAST TECHNIQUE: Multidetector CT imaging of the abdomen and pelvis was performed using the standard protocol following bolus administration of intravenous contrast. CONTRAST:  85mL OMNIPAQUE IOHEXOL 300 MG/ML  SOLN COMPARISON:  None. FINDINGS: Lower chest:  No acute findings. Hepatobiliary: No masses or other significant abnormality. Gallbladder is unremarkable. Pancreas: No mass, inflammatory changes, or other significant abnormality. Spleen: Within normal limits in size and appearance. Adrenals/Urinary Tract: No masses identified. No evidence of hydronephrosis. Stomach/Bowel: Moderate stool noted in ascending and transverse portions of colon. A nondilated loop of small bowel is seen within the right lower quadrant which shows central mesenteric soft tissue stranding on image 50/series 2. This raises suspicion for an internal hernia. No evidence of bowel obstruction, ischemia, or other complication. Vascular/Lymphatic: No pathologically enlarged lymph nodes. No evidence of abdominal aortic aneurysm. Reproductive: No mass or other significant abnormality. Other: None. Musculoskeletal:  No suspicious bone lesions identified. IMPRESSION: Possible internal small bowel hernia in the right lower quadrant. No evidence of bowel obstruction, ischemia, or other acute findings. Electronically Signed   By: Earle Gell M.D.   On: 02/03/2015 23:12    EKG:  No orders found for this or any previous visit.  IMPRESSION AND PLAN:   34 year old Caucasian female history of colitis unspecified presenting with abdominal pain.  1. Colitis unspecified: She history concerning for Crohn's disease and she treated with steroids as an outpatient with some improvement. For now treat with Cipro/Flagyl antibiotic coverage as well as initiate steroids start with 60 mg Solu-Medrol IV converting to oral steroids. Will  consult gastroenterology 2. Melena: Stable hemoglobin,  hold heparin PPI therapy trend CBC 3. Anxiety depression not otherwise specified: Cymbalta    All the records are reviewed and case discussed with ED provider. Management plans discussed with the patient, family and they are in agreement.  CODE STATUS: Full  TOTAL TIME TAKING CARE OF THIS PATIENT: 35 minutes.    Darrius Montano,  Karenann Cai.D on 02/04/2015 at 1:53 AM  Between 7am to 6pm - Pager - [phone]  After 6pm: House Pager: - [phone]  [facility]  Office  [phone]  CC: Primary care physician; Pcp Not In System

## 2015-02-04 NOTE — Progress Notes (Signed)
[name] [place] NAME: Candice Mccall    MR#:  [id number]  DATE OF BIRTH:  September 15, 1980  SUBJECTIVE:  CHIEF COMPLAINT:   Chief Complaint  Patient presents with  . Abdominal Pain   Continued right lower quadrant pain. States that morphine is effective but short acting. Very uncomfortable. No diarrhea or vomiting  REVIEW OF SYSTEMS:   Review of Systems  Constitutional: Negative for fever.  Respiratory: Negative for shortness of breath.   Cardiovascular: Negative for chest pain and palpitations.  Gastrointestinal: Positive for abdominal pain and melena. Negative for nausea and vomiting.  Genitourinary: Negative for dysuria.    DRUG ALLERGIES:   Allergies  Allergen Reactions  . Penicillins Hives and Other (See Comments)    Has patient had a PCN reaction causing immediate rash, facial/tongue/throat swelling, SOB or lightheadedness with hypotension: No Has patient had a PCN reaction causing severe rash involving mucus membranes or skin necrosis: No Has patient had a PCN reaction that required hospitalization No Has patient had a PCN reaction occurring within the last 10 years: No If all of the above answers are "NO", then may proceed with Cephalosporin use.   . Sulfa Antibiotics Rash and Other (See Comments)    Joint pain    VITALS:  Blood pressure 111/64, pulse 59, temperature 98 F (36.7 C), temperature source Oral, resp. rate 19, height 5\' 4"  (1.626 m), weight 57.289 kg (126 lb 4.8 oz), last menstrual period 01/15/2015, SpO2 98 %.  PHYSICAL EXAMINATION:  GENERAL:  34 y.o.-year-old patient lying in the bed uncomfortable LUNGS: Normal breath sounds bilaterally, no wheezing, rales,rhonchi or crepitation. No use of accessory muscles of respiration.  CARDIOVASCULAR: S1, S2 normal. No murmurs, rubs, or gallops.  ABDOMEN: Soft, diffusely tender worse in the right lower quadrant, nondistended. Bowel sounds present. No organomegaly  or mass.  EXTREMITIES: No pedal edema, cyanosis, or clubbing.  NEUROLOGIC: Cranial nerves II through XII are intact. Muscle strength 5/5 in all extremities. Sensation intact. Gait not checked.  PSYCHIATRIC: The patient is alert and oriented x 3.  SKIN: No obvious rash, lesion, or ulcer.    LABORATORY PANEL:   CBC  Recent Labs Lab 02/03/15 1946  WBC 11.1*  HGB 13.5  HCT 41.0  PLT 309   ------------------------------------------------------------------------------------------------------------------  Chemistries   Recent Labs Lab 02/03/15 1946  NA 140  K 4.1  CL 105  CO2 27  GLUCOSE 105*  BUN 7  CREATININE 0.72  CALCIUM 9.4  AST 21  ALT 16  ALKPHOS 65  BILITOT 0.7   ------------------------------------------------------------------------------------------------------------------  Cardiac Enzymes No results for input(s): TROPONINI in the last 168 hours. ------------------------------------------------------------------------------------------------------------------  RADIOLOGY:  Ct Abdomen Pelvis W Contrast  02/03/2015  CLINICAL DATA:  Worsening right lower quadrant pain and melena for several weeks. EXAM: CT ABDOMEN AND PELVIS WITH CONTRAST TECHNIQUE: Multidetector CT imaging of the abdomen and pelvis was performed using the standard protocol following bolus administration of intravenous contrast. CONTRAST:  68mL OMNIPAQUE IOHEXOL 300 MG/ML  SOLN COMPARISON:  None. FINDINGS: Lower chest:  No acute findings. Hepatobiliary: No masses or other significant abnormality. Gallbladder is unremarkable. Pancreas: No mass, inflammatory changes, or other significant abnormality. Spleen: Within normal limits in size and appearance. Adrenals/Urinary Tract: No masses identified. No evidence of hydronephrosis. Stomach/Bowel: Moderate stool noted in ascending and transverse portions of colon. A nondilated loop of small bowel is seen within the right lower quadrant which shows central  mesenteric soft tissue stranding on image 50/series  2. This raises suspicion for an internal hernia. No evidence of bowel obstruction, ischemia, or other complication. Vascular/Lymphatic: No pathologically enlarged lymph nodes. No evidence of abdominal aortic aneurysm. Reproductive: No mass or other significant abnormality. Other: None. Musculoskeletal:  No suspicious bone lesions identified. IMPRESSION: Possible internal small bowel hernia in the right lower quadrant. No evidence of bowel obstruction, ischemia, or other acute findings. Electronically Signed   By: Earle Gell M.D.   On: 02/03/2015 23:12    EKG:  No orders found for this or any previous visit.  ASSESSMENT AND PLAN:    1. Colitis unspecified: She history concerning for possible Crohn's disease and she treated with steroids as an outpatient with some improvement. For now treat with Cipro/Flagyl antibiotic coverage as well as IV Solu-Medrol. Appreciate gastro-enteral to consultation and planned for Endoscopy on Monday. We will increase frequency of morphine doses but decreased actual dose for that her coverage.  2. Melena: Stable hemoglobin, hold heparin. PPI therapy. trend CBC. No overt bleeding she reports black tarry stools only  3. Anxiety depression not otherwise specified: Cymbalta  All the records are reviewed and case discussed with Care Management/Social Workerr. Management plans discussed with the patient, family and they are in agreement.  CODE STATUS: Full   TOTAL TIME TAKING CARE OF THIS PATIENT: 20 minutes.  Greater than 50% of time spent in care coordination and counseling. POSSIBLE D/C IN 2 DAYS, DEPENDING ON CLINICAL CONDITION.   Myrtis Ser M.D on 02/04/2015 at 3:39 PM  Between 7am to 6pm - Pager - [phone]  After 6pm go to [email or web address] - password [email or web address] [facility]  Office  [phone]  CC: Primary care physician; Pcp Not In System

## 2015-02-04 NOTE — Plan of Care (Signed)
Problem: Education: Goal: Knowledge of [facility] Education information/materials will improve Outcome: Progressing Patient admitted to room 130 with Colitis. Pt provided with handouts r/t diagnosis and new medications being given, pt verbalized understanding. Pt currently c/o RLQ abd pain, improvement noted with PRN pain medications. IVF infusing per MD order. Patient denies any needs currently.   Problem: Safety: Goal: Ability to remain free from injury will improve Outcome: Progressing Pt is low fall risk, A&O, and steady gait. Up to BR independent. Patient has remained free of fall or injury this shift.      Problem: Health Behavior/Discharge Planning: Goal: Ability to manage health-related needs will improve Outcome: Progressing New diagnosis for patient, was in the ED at [facility] yesterday with no improvement noted. Patient back to ED here at [facility] with uncontrolled abd pain. Handout given with information r/t colitis.      Problem: Pain Managment: Goal: General experience of comfort will improve Outcome: Progressing Pt c/o RLQ, improved with PRN pain medications.      Problem: Physical Regulation: Goal: Will remain free from infection Outcome: Not Progressing Current WBC 11.1, pt currently on IV antibiotics.  Continue to monitor VSS and labs.      Problem: Tissue Perfusion: Goal: Risk factors for ineffective tissue perfusion will decrease Outcome: Progressing Pt A&O, ambulating up to BR.   Problem: Fluid Volume: Goal: Ability to maintain a balanced intake and output will improve Outcome: Progressing IVF infusing per MD order. Collection container in toilet for accurate output.      Problem: Nutrition: Goal: Adequate nutrition will be maintained Outcome: Progressing Pt admitted with RLQ abd pain, admitted with Colitis. Encourage PO intake, offer dietary consult to assist with food choices.      Problem: Bowel/Gastric: Goal: Will not experience complications  related to bowel motility Outcome: Progressing Pt currently c/o RLQ abd pain, admitted with diagnosis of colitis. Pt reports last BM was 02/03/2015. Continue to monitor for constipation.

## 2015-02-04 NOTE — ED Notes (Signed)
MD Lord at bedside. 

## 2015-02-05 LAB — CBC
HEMATOCRIT: 35.2 % (ref 35.0–47.0)
Hemoglobin: 11.5 g/dL — ABNORMAL LOW (ref 12.0–16.0)
MCH: 29.9 pg (ref 26.0–34.0)
MCHC: 32.6 g/dL (ref 32.0–36.0)
MCV: 91.7 fL (ref 80.0–100.0)
Platelets: 260 10*3/uL (ref 150–440)
RBC: 3.83 MIL/uL (ref 3.80–5.20)
RDW: 13.7 % (ref 11.5–14.5)
WBC: 8.4 10*3/uL (ref 3.6–11.0)

## 2015-02-05 LAB — BASIC METABOLIC PANEL
ANION GAP: 5 (ref 5–15)
BUN: 8 mg/dL (ref 6–20)
CALCIUM: 8.4 mg/dL — AB (ref 8.9–10.3)
CO2: 28 mmol/L (ref 22–32)
Chloride: 108 mmol/L (ref 101–111)
Creatinine, Ser: 0.83 mg/dL (ref 0.44–1.00)
GFR calc Af Amer: >60 mL/min (ref >60)
GFR calc non Af Amer: >60 mL/min (ref >60)
GLUCOSE: 91 mg/dL (ref 65–99)
Potassium: 3.8 mmol/L (ref 3.5–5.1)
Sodium: 141 mmol/L (ref 135–145)

## 2015-02-05 LAB — C-REACTIVE PROTEIN: CRP: <0.5 mg/dL (ref <1.0)

## 2015-02-05 LAB — SEDIMENTATION RATE: Sed Rate: 1 mm/hr (ref 0–20)

## 2015-02-05 MED ORDER — MORPHINE SULFATE (PF) 2 MG/ML IV SOLN
2.0000 mg | Freq: Once | INTRAVENOUS | Status: AC
  Administered 2015-02-05: 23:00:00 2 mg via INTRAVENOUS
  Filled 2015-02-05: qty 1

## 2015-02-05 NOTE — Progress Notes (Signed)
[facility] at [place] NAME: Candice Mccall    MR#:  [id number]  DATE OF BIRTH:  July 07, 1980  SUBJECTIVE:  CHIEF COMPLAINT:   Chief Complaint  Patient presents with  . Abdominal Pain   Awoke without pain this morning but then drinks orange juice and had recurrence. Shorter intervals between pain medication is working better to control her symptoms. Dose has not been increased  REVIEW OF SYSTEMS:   Review of Systems  Constitutional: Negative for fever.  Respiratory: Negative for shortness of breath.   Cardiovascular: Negative for chest pain and palpitations.  Gastrointestinal: Positive for abdominal pain and melena. Negative for nausea and vomiting.  Genitourinary: Negative for dysuria.    DRUG ALLERGIES:   Allergies  Allergen Reactions  . Penicillins Hives and Other (See Comments)    Has patient had a PCN reaction causing immediate rash, facial/tongue/throat swelling, SOB or lightheadedness with hypotension: No Has patient had a PCN reaction causing severe rash involving mucus membranes or skin necrosis: No Has patient had a PCN reaction that required hospitalization No Has patient had a PCN reaction occurring within the last 10 years: No If all of the above answers are "NO", then may proceed with Cephalosporin use.   . Sulfa Antibiotics Rash and Other (See Comments)    Joint pain    VITALS:  Blood pressure 110/59, pulse 58, temperature 98.2 F (36.8 C), temperature source Oral, resp. rate 18, height 5\' 4"  (1.626 m), weight 57.289 kg (126 lb 4.8 oz), last menstrual period 01/15/2015, SpO2 98 %.  PHYSICAL EXAMINATION:  GENERAL:  34 y.o.-year-old patient lying in the bed no acute distress LUNGS: Normal breath sounds bilaterally, no wheezing, rales,rhonchi or crepitation. No use of accessory muscles of respiration.  CARDIOVASCULAR: S1, S2 normal. No murmurs, rubs, or gallops.  ABDOMEN: Soft, diffusely tender worse in the right  lower quadrant, nondistended. Bowel sounds present. No organomegaly or mass.  EXTREMITIES: No pedal edema, cyanosis, or clubbing.  NEUROLOGIC: Cranial nerves II through XII are intact. Muscle strength 5/5 in all extremities. Sensation intact. Gait not checked.  PSYCHIATRIC: The patient is alert and oriented x 3.  SKIN: No obvious rash, lesion, or ulcer.    LABORATORY PANEL:   CBC  Recent Labs Lab 02/05/15 0340  WBC 8.4  HGB 11.5*  HCT 35.2  PLT 260   ------------------------------------------------------------------------------------------------------------------  Chemistries   Recent Labs Lab 02/03/15 1946 02/05/15 0340  NA 140 141  K 4.1 3.8  CL 105 108  CO2 27 28  GLUCOSE 105* 91  BUN 7 8  CREATININE 0.72 0.83  CALCIUM 9.4 8.4*  AST 21  --   ALT 16  --   ALKPHOS 65  --   BILITOT 0.7  --    ------------------------------------------------------------------------------------------------------------------  Cardiac Enzymes No results for input(s): TROPONINI in the last 168 hours. ------------------------------------------------------------------------------------------------------------------  RADIOLOGY:  Ct Abdomen Pelvis W Contrast  02/03/2015  CLINICAL DATA:  Worsening right lower quadrant pain and melena for several weeks. EXAM: CT ABDOMEN AND PELVIS WITH CONTRAST TECHNIQUE: Multidetector CT imaging of the abdomen and pelvis was performed using the standard protocol following bolus administration of intravenous contrast. CONTRAST:  72mL OMNIPAQUE IOHEXOL 300 MG/ML  SOLN COMPARISON:  None. FINDINGS: Lower chest:  No acute findings. Hepatobiliary: No masses or other significant abnormality. Gallbladder is unremarkable. Pancreas: No mass, inflammatory changes, or other significant abnormality. Spleen: Within normal limits in size and appearance. Adrenals/Urinary Tract: No masses identified. No evidence of hydronephrosis.  Stomach/Bowel: Moderate stool noted in  ascending and transverse portions of colon. A nondilated loop of small bowel is seen within the right lower quadrant which shows central mesenteric soft tissue stranding on image 50/series 2. This raises suspicion for an internal hernia. No evidence of bowel obstruction, ischemia, or other complication. Vascular/Lymphatic: No pathologically enlarged lymph nodes. No evidence of abdominal aortic aneurysm. Reproductive: No mass or other significant abnormality. Other: None. Musculoskeletal:  No suspicious bone lesions identified. IMPRESSION: Possible internal small bowel hernia in the right lower quadrant. No evidence of bowel obstruction, ischemia, or other acute findings. Electronically Signed   By: Earle Gell M.D.   On: 02/03/2015 23:12    EKG:  No orders found for this or any previous visit.  ASSESSMENT AND PLAN:    1. Colitis unspecified: She history concerning for possible Crohn's disease and she treated with steroids as an outpatient with some improvement. For now treat with Cipro/Flagyl antibiotic coverage as well as prednisone. Appreciate gastro-enteral to consultation and planned for Endoscopy on Monday. Pain is currently controlled. No further stools but is passing gas. Does have a small internal small bowel hernia in the right lower quadrant, I have discussed with radiology and also with surgery this is not likely the cause of her symptoms.  2. Melena: Stable hemoglobin, hold heparin. PPI therapy. trend CBC. No overt bleeding she reports black tarry stools only  3. Anxiety depression not otherwise specified: Cymbalta  All the records are reviewed and case discussed with Care Management/Social Workerr. Management plans discussed with the patient, family and they are in agreement.  CODE STATUS: Full   TOTAL TIME TAKING CARE OF THIS PATIENT: 20 minutes.  Greater than 50% of time spent in care coordination and counseling. POSSIBLE D/C IN 2 DAYS, DEPENDING ON CLINICAL  CONDITION.   Myrtis Ser M.D on 02/05/2015 at 2:46 PM  Between 7am to 6pm - Pager - [phone]  After 6pm go to [email or web address] - password [email or web address] [facility]  Office  [phone]  CC: Primary care physician; Pcp Not In System

## 2015-02-05 NOTE — Consult Note (Signed)
GI Inpatient Follow-up Note  Patient Identification: Candice Mccall is a 34 y.o. female with RLQ abdominal pain, nausea  Subjective:  She reports that her RLQ abdominal pain has improved slightly.  She did try to drink some OJ this morning, made her nauseas, she has not tried anything else since then.  Her lunch was untouched.  She has not had a BM since admission, she did report passing some gas today.   Scheduled Inpatient Medications:  . ciprofloxacin  400 mg Intravenous Q12H  . DULoxetine  30 mg Oral Daily  . metronidazole  500 mg Intravenous Q8H  . predniSONE  60 mg Oral Q breakfast    Continuous Inpatient Infusions:   . sodium chloride 75 mL/hr at 02/05/15 1225    PRN Inpatient Medications:  acetaminophen **OR** acetaminophen, diphenhydrAMINE, morphine injection, ondansetron **OR** ondansetron (ZOFRAN) IV, oxyCODONE  Review of Systems: Constitutional: Weight is stable.  Eyes: No changes in vision. ENT: No oral lesions, sore throat.  GI: see HPI.  Heme/Lymph: No easy bruising.  CV: No chest pain.  GU: No hematuria.  Integumentary: No rashes.  Neuro: No headaches.  Psych: No depression/anxiety.  Endocrine: No heat/cold intolerance.  Allergic/Immunologic: No urticaria.  Resp: No cough, SOB.  Musculoskeletal: No joint swelling.    Physical Examination: BP 110/59 mmHg  Pulse 58  Temp(Src) 98.2 F (36.8 C) (Oral)  Resp 18  Ht 5\' 4"  (1.626 m)  Wt 57.289 kg (126 lb 4.8 oz)  BMI 21.67 kg/m2  SpO2 98%  LMP 01/15/2015 Gen: NAD, alert and oriented x 4 HEENT: PEERLA, EOMI, Neck: supple, no JVD or thyromegaly Chest: CTA bilaterally, no wheezes, crackles, or other adventitious sounds CV: RRR, no m/g/c/r Abd: soft, RLQ tenderness, ND, hypoactive BS in all four quadrants; no HSM, guarding, ridigity, or rebound tenderness Ext: no edema, well perfused with 2+ pulses, Skin: no rash or lesions noted Lymph: no LAD  Data: Lab Results  Component Value Date   WBC 8.4  02/05/2015   HGB 11.5* 02/05/2015   HCT 35.2 02/05/2015   MCV 91.7 02/05/2015   PLT 260 02/05/2015    Recent Labs Lab 02/03/15 1946 02/05/15 0340  HGB 13.5 11.5*   Lab Results  Component Value Date   NA 141 02/05/2015   K 3.8 02/05/2015   CL 108 02/05/2015   CO2 28 02/05/2015   BUN 8 02/05/2015   CREATININE 0.83 02/05/2015   Lab Results  Component Value Date   ALT 16 02/03/2015   AST 21 02/03/2015   ALKPHOS 65 02/03/2015   BILITOT 0.7 02/03/2015   No results for input(s): APTT, INR, PTT in the last 168 hours. Assessment/Plan: Candice Mccall is a 34 y.o. female with RLQ abdominal pain, nausea.  WBC has improved from 11.1 to 8.4, SED rate of 1, CRP still pending.    Recommendations: Patient has been discussed with Dr. Vira Agar, continue with current treatment.  Will schedule capsule endoscopy for the morning.  We will continue to follow with you. Please call with questions or concerns.  Salvadore Farber, PA-C  I personally performed these services.

## 2015-02-05 NOTE — Plan of Care (Signed)
Problem: Safety: Goal: Ability to remain free from injury will improve Outcome: Progressing Pt is low falls, use call bell when needed  Problem: Pain Managment: Goal: General experience of comfort will improve Outcome: Progressing Pt c/o RLQ pain, prn meds given with improvement, pt resting comfortably in bed  Problem: Tissue Perfusion: Goal: Risk factors for ineffective tissue perfusion will decrease Outcome: Progressing VSS, IVF infusing per orders, IV ABT per orders  Problem: Nutrition: Goal: Adequate nutrition will be maintained Outcome: Not Progressing Pt had poor po intake this shift, still getting nausea when eating  Problem: Bowel/Gastric: Goal: Will not experience complications related to bowel motility Outcome: Progressing No BM this shift

## 2015-02-05 NOTE — Plan of Care (Signed)
Problem: Education: Goal: Knowledge of [facility] Education information/materials will improve Outcome: Progressing Pt provided with education r/t fall risk, prn pain meds, and pain scale. Pt verbalized understanding. Pt continues to c/o RLQ abd pain, improvement noted with PRN pain medications. IVF infusing per MD order. Patient denies any needs currently.  Problem: Safety: Goal: Ability to remain free from injury will improve Outcome: Progressing Pt is low fall risk, A&O, and steady gait. Up to BR independent. Patient has remained free of fall or injury this shift.   Problem: Health Behavior/Discharge Planning: Goal: Ability to manage health-related needs will improve Outcome: Progressing Pt alert and oriented, communicates needs well.   Problem: Pain Managment: Goal: General experience of comfort will improve Outcome: Progressing Pt c/o RLQ, improved with PRN pain medications. Pt states that pain is tolerable at a "5"  Problem: Physical Regulation: Goal: Will remain free from infection Outcome: Progressing WBC's currently 8.4, pt is on IV flagyl and IV cipro.  Problem: Tissue Perfusion: Goal: Risk factors for ineffective tissue perfusion will decrease Outcome: Progressing Pt A&O, ambulating up to BR.   Problem: Fluid Volume: Goal: Ability to maintain a balanced intake and output will improve Outcome: Progressing IVF infusing per MD order. Pt independent up to BR.   Problem: Nutrition: Goal: Adequate nutrition will be maintained Outcome: Not Progressing Pt only taking sips of water, verbalized that she is afraid to eat or drink, states that its increases pain. Encourage PO intake, offer dietary consult to assist with food choices.   Problem: Bowel/Gastric: Goal: Will not experience complications related to bowel motility Outcome: Progressing Pt reports last BM was 02/03/2015. Continue to monitor for constipation.

## 2015-02-06 LAB — CBC
HCT: 34.8 % — ABNORMAL LOW (ref 35.0–47.0)
Hemoglobin: 12 g/dL (ref 12.0–16.0)
MCH: 31.6 pg (ref 26.0–34.0)
MCHC: 34.6 g/dL (ref 32.0–36.0)
MCV: 91.3 fL (ref 80.0–100.0)
PLATELETS: 247 10*3/uL (ref 150–440)
RBC: 3.81 MIL/uL (ref 3.80–5.20)
RDW: 13.7 % (ref 11.5–14.5)
WBC: 10.1 10*3/uL (ref 3.6–11.0)

## 2015-02-06 LAB — BASIC METABOLIC PANEL
Anion gap: 4 — ABNORMAL LOW (ref 5–15)
BUN: 9 mg/dL (ref 6–20)
CO2: 28 mmol/L (ref 22–32)
Calcium: 8.3 mg/dL — ABNORMAL LOW (ref 8.9–10.3)
Chloride: 107 mmol/L (ref 101–111)
Creatinine, Ser: 0.86 mg/dL (ref 0.44–1.00)
GFR calc Af Amer: >60 mL/min (ref >60)
GFR calc non Af Amer: >60 mL/min (ref >60)
Glucose, Bld: 100 mg/dL — ABNORMAL HIGH (ref 65–99)
Potassium: 3.5 mmol/L (ref 3.5–5.1)
Sodium: 139 mmol/L (ref 135–145)

## 2015-02-06 LAB — C-REACTIVE PROTEIN: CRP: <0.5 mg/dL (ref <1.0)

## 2015-02-06 LAB — SEDIMENTATION RATE: Sed Rate: 4 mm/hr (ref 0–20)

## 2015-02-06 MED ORDER — POLYETHYLENE GLYCOL 3350 17 GM/SCOOP PO POWD
1.0000 | Freq: Once | ORAL | Status: AC
  Administered 2015-02-06: 07:00:00 255 g via ORAL
  Filled 2015-02-06: qty 255

## 2015-02-06 MED ORDER — POLYETHYLENE GLYCOL 3350 17 GM/SCOOP PO POWD
1.0000 | Freq: Once | ORAL | Status: AC
  Administered 2015-02-06: 19:00:00 255 g via ORAL
  Filled 2015-02-06: qty 255

## 2015-02-06 NOTE — Plan of Care (Signed)
Problem: Safety: Goal: Ability to remain free from injury will improve Outcome: Completed/Met Date Met:  02/06/15 Patient independent with needs. Calls out for assistance. No falls.  Problem: Health Behavior/Discharge Planning: Goal: Ability to manage health-related needs will improve Outcome: Completed/Met Date Met:  02/06/15 Patient will plan to be discharged home when ready. Supportive family.  Problem: Pain Managment: Goal: General experience of comfort will improve Outcome: Progressing C/o pain in right lower quadrant. IV morphine given. Patient stated relief of pain 5/10, does not want any other intervention at that point.  Problem: Physical Regulation: Goal: Will remain free from infection Outcome: Progressing Afebrile. VSS.  Problem: Tissue Perfusion: Goal: Risk factors for ineffective tissue perfusion will decrease Outcome: Progressing Patient walking around, pumping legs while in bed.  Problem: Fluid Volume: Goal: Ability to maintain a balanced intake and output will improve Outcome: Progressing IVF infusing.   Problem: Nutrition: Goal: Adequate nutrition will be maintained Outcome: Progressing Clear liquid diet. Feels bloated. Had 1 cup of chicken broth.  Problem: Bowel/Gastric: Goal: Will not experience complications related to bowel motility Outcome: Progressing zofran given 1x for nausea. Patient to have capsule study tomorrow. Currently doing prep at this time.

## 2015-02-06 NOTE — Progress Notes (Signed)
Candice Mccall at [place] NAME: Candice Mccall    MR#:  [id number]  DATE OF BIRTH:  27-Feb-1981  SUBJECTIVE:  CHIEF COMPLAINT:   Chief Complaint  Patient presents with  . Abdominal Pain   Continues to have significant right lower quadrant pain. Tearful. Unable to eat, sips only  REVIEW OF SYSTEMS:   Review of Systems  Constitutional: Negative for fever.  Respiratory: Negative for shortness of breath.   Cardiovascular: Negative for chest pain and palpitations.  Gastrointestinal: Positive for abdominal pain and melena. Negative for nausea and vomiting.  Genitourinary: Negative for dysuria.    DRUG ALLERGIES:   Allergies  Allergen Reactions  . Penicillins Hives and Other (See Comments)    Has patient had a PCN reaction causing immediate rash, facial/tongue/throat swelling, SOB or lightheadedness with hypotension: No Has patient had a PCN reaction causing severe rash involving mucus membranes or skin necrosis: No Has patient had a PCN reaction that required hospitalization No Has patient had a PCN reaction occurring within the last 10 years: No If all of the above answers are "NO", then may proceed with Cephalosporin use.   . Sulfa Antibiotics Rash and Other (See Comments)    Joint pain    VITALS:  Blood pressure 112/73, pulse 72, temperature 98.2 F (36.8 C), temperature source Oral, resp. rate 19, height 5\' 4"  (1.626 m), weight 57.289 kg (126 lb 4.8 oz), last menstrual period 01/15/2015, SpO2 98 %.  PHYSICAL EXAMINATION:  GENERAL:  34 y.o.-year-old patient lying in the bed , tearful anxious LUNGS: Normal breath sounds bilaterally, no wheezing, rales,rhonchi or crepitation. No use of accessory muscles of respiration.  CARDIOVASCULAR: S1, S2 normal. No murmurs, rubs, or gallops.  ABDOMEN: Soft, diffusely tender worse in the right lower quadrant, nondistended. Bowel sounds present. No organomegaly or mass.  EXTREMITIES: No pedal  edema, cyanosis, or clubbing.  NEUROLOGIC: Cranial nerves II through XII are intact. Muscle strength 5/5 in all extremities. Sensation intact. Gait not checked.  PSYCHIATRIC: The patient is alert and oriented x 3.  SKIN: No obvious rash, lesion, or ulcer.    LABORATORY PANEL:   CBC  Recent Labs Lab 02/06/15 0425  WBC 10.1  HGB 12.0  HCT 34.8*  PLT 247   ------------------------------------------------------------------------------------------------------------------  Chemistries   Recent Labs Lab 02/03/15 1946  02/06/15 0425  NA 140  < > 139  K 4.1  < > 3.5  CL 105  < > 107  CO2 27  < > 28  GLUCOSE 105*  < > 100*  BUN 7  < > 9  CREATININE 0.72  < > 0.86  CALCIUM 9.4  < > 8.3*  AST 21  --   --   ALT 16  --   --   ALKPHOS 65  --   --   BILITOT 0.7  --   --   < > = values in this interval not displayed. ------------------------------------------------------------------------------------------------------------------  Cardiac Enzymes No results for input(s): TROPONINI in the last 168 hours. ------------------------------------------------------------------------------------------------------------------  RADIOLOGY:  No results found.  EKG:  No orders found for this or any previous visit.  ASSESSMENT AND PLAN:    1. Colitis unspecified: She has history concerning for possible Crohn's disease was treated with steroids as an outpatient with some improvement. For now treat with Cipro/Flagyl antibiotic coverage as well as prednisone. Appreciate gastro-enteral to consultation and planned for Endoscopy tomorrow. Pain is currently controlled. No further stools but is passing gas. Does  have a small internal small bowel hernia in the right lower quadrant, I have discussed with radiology and also with surgery this is not likely the cause of her symptoms.  2. Melena: Stable hemoglobin, hold heparin. PPI therapy. trend CBC. No overt bleeding she reports black tarry stools  only  3. Anxiety depression not otherwise specified: Cymbalta  All the records are reviewed and case discussed with Care Management/Social Workerr. Management plans discussed with the patient, family and they are in agreement.  CODE STATUS: Full   TOTAL TIME TAKING CARE OF THIS PATIENT: 20 minutes.  Greater than 50% of time spent in care coordination and counseling. POSSIBLE D/C IN 2 DAYS, DEPENDING ON CLINICAL CONDITION.   Myrtis Ser M.D on 02/06/2015 at 4:59 PM  Between 7am to 6pm - Pager - [phone]  After 6pm go to [email or web address] - password [email or web address] [facility]  Office  [phone]  CC: Primary care physician; Pcp Not In System

## 2015-02-07 ENCOUNTER — Encounter
Admission: EM | Disposition: A | Discharge status: Home / Self Care | Payer: Self-pay | Source: Home / Self Care | Attending: Internal Medicine

## 2015-02-07 HISTORY — PX: GIVENS CAPSULE STUDY: SHX5432

## 2015-02-07 LAB — CBC
HEMATOCRIT: 38.2 % (ref 35.0–47.0)
HEMOGLOBIN: 12.4 g/dL (ref 12.0–16.0)
MCH: 29.9 pg (ref 26.0–34.0)
MCHC: 32.4 g/dL (ref 32.0–36.0)
MCV: 92.4 fL (ref 80.0–100.0)
Platelets: 287 10*3/uL (ref 150–440)
RBC: 4.14 MIL/uL (ref 3.80–5.20)
RDW: 13.7 % (ref 11.5–14.5)
WBC: 10.9 10*3/uL (ref 3.6–11.0)

## 2015-02-07 LAB — COMPREHENSIVE METABOLIC PANEL
ALBUMIN: 3.5 g/dL (ref 3.5–5.0)
ALK PHOS: 49 U/L (ref 38–126)
ALT: 10 U/L — ABNORMAL LOW (ref 14–54)
ANION GAP: 6 (ref 5–15)
AST: 15 U/L (ref 15–41)
BILIRUBIN TOTAL: 0.4 mg/dL (ref 0.3–1.2)
CALCIUM: 8.2 mg/dL — AB (ref 8.9–10.3)
CO2: 27 mmol/L (ref 22–32)
Chloride: 111 mmol/L (ref 101–111)
Creatinine, Ser: 0.82 mg/dL (ref 0.44–1.00)
GFR calc Af Amer: >60 mL/min (ref >60)
GFR calc non Af Amer: >60 mL/min (ref >60)
GLUCOSE: 94 mg/dL (ref 65–99)
Potassium: 3.5 mmol/L (ref 3.5–5.1)
Sodium: 144 mmol/L (ref 135–145)
TOTAL PROTEIN: 5.3 g/dL — AB (ref 6.5–8.1)

## 2015-02-07 LAB — ANA W/REFLEX: ANA: NEGATIVE

## 2015-02-07 SURGERY — IMAGING PROCEDURE, GI TRACT, INTRALUMINAL, VIA CAPSULE

## 2015-02-07 MED ORDER — PREDNISONE 20 MG PO TABS
20.0000 mg | ORAL_TABLET | Freq: Every day | ORAL | Status: DC
  Administered 2015-02-08: 20 mg via ORAL
  Filled 2015-02-07: qty 1

## 2015-02-07 MED ORDER — METRONIDAZOLE 500 MG PO TABS
500.0000 mg | ORAL_TABLET | Freq: Three times a day (TID) | ORAL | Status: DC
  Administered 2015-02-07 – 2015-02-08 (×2): 500 mg via ORAL
  Filled 2015-02-07 (×2): qty 1

## 2015-02-07 MED ORDER — CIPROFLOXACIN HCL 500 MG PO TABS
500.0000 mg | ORAL_TABLET | Freq: Two times a day (BID) | ORAL | Status: DC
  Administered 2015-02-07 – 2015-02-08 (×2): 500 mg via ORAL
  Filled 2015-02-07 (×2): qty 1

## 2015-02-07 MED ORDER — BOOST / RESOURCE BREEZE PO LIQD: 1.0000 | Freq: Three times a day (TID) | ORAL | Status: DC

## 2015-02-07 NOTE — Plan of Care (Signed)
Problem: Pain Managment: Goal: General experience of comfort will improve Outcome: Progressing Pt c/o pain in abdomen 7/10, declines any pain medication at this time.  Problem: Fluid Volume: Goal: Ability to maintain a balanced intake and output will improve Outcome: Progressing Pt is NPO after midnight. Receiving IVF with antibiotics.   Problem: Nutrition: Goal: Adequate nutrition will be maintained Outcome: Progressing Pt unable to tolerate PO medications well. Pt states she has not eaten much since last Thursday.

## 2015-02-07 NOTE — Progress Notes (Signed)
[place] at [place] NAME: Candice Mccall    MR#:  [id number]  DATE OF BIRTH:  08/23/1980  SUBJECTIVE:  CHIEF COMPLAINT:   Chief Complaint  Patient presents with  . Abdominal Pain   Continues to have right lower quadrant pain. Completed the prep. Endoscopy Today  REVIEW OF SYSTEMS:   Review of Systems  Constitutional: Negative for fever.  Respiratory: Negative for shortness of breath.   Cardiovascular: Negative for chest pain and palpitations.  Gastrointestinal: Positive for abdominal pain and melena. Negative for nausea and vomiting.  Genitourinary: Negative for dysuria.    DRUG ALLERGIES:   Allergies  Allergen Reactions  . Penicillins Hives and Other (See Comments)    Has patient had a PCN reaction causing immediate rash, facial/tongue/throat swelling, SOB or lightheadedness with hypotension: No Has patient had a PCN reaction causing severe rash involving mucus membranes or skin necrosis: No Has patient had a PCN reaction that required hospitalization No Has patient had a PCN reaction occurring within the last 10 years: No If all of the above answers are "NO", then may proceed with Cephalosporin use.   . Sulfa Antibiotics Rash and Other (See Comments)    Joint pain    VITALS:  Blood pressure 117/83, pulse 77, temperature 99.1 F (37.3 C), temperature source Oral, resp. rate 18, height 5\' 4"  (1.626 m), weight 57.289 kg (126 lb 4.8 oz), last menstrual period 01/15/2015, SpO2 100 %.  PHYSICAL EXAMINATION:  GENERAL:  34 y.o.-year-old patient lying in the bed , tearful anxious LUNGS: Normal breath sounds bilaterally, no wheezing, rales,rhonchi or crepitation. No use of accessory muscles of respiration.  CARDIOVASCULAR: S1, S2 normal. No murmurs, rubs, or gallops.  ABDOMEN: Soft, diffusely tender worse in the right lower quadrant, nondistended. Bowel sounds present. No organomegaly or mass.  EXTREMITIES: No pedal edema,  cyanosis, or clubbing.  NEUROLOGIC: Cranial nerves II through XII are intact. Muscle strength 5/5 in all extremities. Sensation intact. Gait not checked.  PSYCHIATRIC: The patient is alert and oriented x 3.  SKIN: No obvious rash, lesion, or ulcer.    LABORATORY PANEL:   CBC  Recent Labs Lab 02/07/15 0425  WBC 10.9  HGB 12.4  HCT 38.2  PLT 287   ------------------------------------------------------------------------------------------------------------------  Chemistries   Recent Labs Lab 02/07/15 0425  NA 144  K 3.5  CL 111  CO2 27  GLUCOSE 94  BUN <5*  CREATININE 0.82  CALCIUM 8.2*  AST 15  ALT 10*  ALKPHOS 49  BILITOT 0.4   ------------------------------------------------------------------------------------------------------------------  Cardiac Enzymes No results for input(s): TROPONINI in the last 168 hours. ------------------------------------------------------------------------------------------------------------------  RADIOLOGY:  No results found.  EKG:  No orders found for this or any previous visit.  ASSESSMENT AND PLAN:    1. Colitis unspecified:  - Admission CT scan showing small internal hernia of small bowel in the right lower quadrant. Possibly adhesions. Some stranding and edema in this area. - Possible Crohn's disease versus IBS, followed at [facility] gastroenterology - Has been treated during this hospitalization with Cipro and Flagyl as well as prednisone. Had been treated with prednisone in the outpatient setting with some relief. - Capsule endoscopy today 11/22 - Possible discharge tomorrow to follow-up with [facility] -Will likely need prescription for pain control upon discharge.  2. Melena: Stable hemoglobin, hold heparin. PPI therapy. trend CBC. No overt bleeding   3. Anxiety depression not otherwise specified: Cymbalta  All the records are reviewed and case discussed with  Care Management/Social Workerr. Management plans discussed  with the patient, family and they are in agreement.  CODE STATUS: Full   TOTAL TIME TAKING CARE OF THIS PATIENT: 20 minutes.  Greater than 50% of time spent in care coordination and counseling. Care plan discussed with gastroenterology also with the patient and her husband. POSSIBLE D/C IN 1 DAYS, DEPENDING ON CLINICAL CONDITION.   Myrtis Ser M.D on 02/07/2015 at 3:11 PM  Between 7am to 6pm - Pager - [phone]  After 6pm go to [email or web address] - password [email or web address] [facility]  Office  [phone]  CC: Primary care physician; Pcp Not In System

## 2015-02-07 NOTE — Progress Notes (Signed)
Patient had some ABD pain during shift but declined any pain medication  Capsule Endoscopy done today patient tolerated procedure well, husband at beside  VSS Changed over to Oral Flagyl this shift, continues on IV  NS    Continue to monitor

## 2015-02-07 NOTE — Progress Notes (Signed)
Initial Nutrition Assessment   INTERVENTION:   Coordination of Care: Will await diet progression as medically able and follow poc, making recommendations accordingly. Will recommend daily weights. Medical Food Supplement Therapy: will recommend Boost Breeze po TID, each supplement provides 250 kcal and 9 grams of protein once diet order able to be advanced   NUTRITION DIAGNOSIS:   Inadequate oral intake related to altered GI function, poor appetite, chronic illness as evidenced by per patient/family report (NPO/CL diet order).  GOAL:   Patient will meet greater than or equal to 90% of their needs  MONITOR:    (Energy Intake, Electrolyte and Renal Profile, Digestive System, Anthropometrics)  REASON FOR ASSESSMENT:   Consult Poor PO  ASSESSMENT:   Pt admitted with abdominal pain secondary to likely colitis. GI following; plan for [facility] Capsule endoscopy 11/22. Pt had just swallowed capsule on visit this am. Pt with multiple loose stools s/p prep this am.  Past Medical History  Diagnosis Date  . Colitis     Diet Order:  Diet NPO time specified    Current Nutrition: Pt NPO (CL/NPO day 4) Pt tolerated one cup chicken broth yesterday otherwise minimal intake since admission.  Food/Nutrition-Related History: Pt reports appetite has been effected by abdominal symptoms for the past 3 years. Pt reports over 3 years ago starting to eliminate certain foods to assess tolerance. Pt reports using Paleo eating techniques for a while and doing better. Pt reports having been pregnant one year ago and had GI symptoms while pregnant as well. Pt reports after pregnancy for the past year continuing to eat Paleo with relief at times. Pt reports having no appetite and hardly ever feeling hungry. Pt reports attempting to eat small frequent meals however d/t no appetite forgetting to eat small meals and difficult sometimes with 2 small children. Pt reports dizziness/dull headache would usually be the  only sign to eat something. Pt reports not having done supplements but would put collagen powder in tea. Pt reports she would drink water/tea often during the day.   Scheduled Medications:  . ciprofloxacin  400 mg Intravenous Q12H  . DULoxetine  30 mg Oral Daily  . metronidazole  500 mg Intravenous Q8H  . predniSONE  60 mg Oral Q breakfast    Continuous Medications:  . sodium chloride 75 mL/hr at 02/06/15 0108     Electrolyte/Renal Profile and Glucose Profile:   Recent Labs Lab 02/05/15 0340 02/06/15 0425 02/07/15 0425  NA 141 139 144  K 3.8 3.5 3.5  CL 108 107 111  CO2 28 28 27   BUN 8 9 <5*  CREATININE 0.83 0.86 0.82  CALCIUM 8.4* 8.3* 8.2*  GLUCOSE 91 100* 94   Protein Profile:   Recent Labs Lab 02/03/15 1946 02/07/15 0425  ALBUMIN 4.9 3.5    Gastrointestinal Profile: Last BM: 02/07/2015   Nutrition-Focused Physical Exam Findings:  Unable to complete Nutrition-Focused physical exam at this time.    Weight Change: Pt reports weight loss prior to birth of second child (despite eating large amounts). Pt reports after birth of second child one year ago, pt reports weight gain unintentionally. Pt reports feeling fluid, bloatedness and weight gain can even be 10lbs in one day. Weight trend per CHL over 6 year span.   Height:   Ht Readings from Last 1 Encounters:  02/04/15 5\' 4"  (1.626 m)    Weight:   Wt Readings from Last 1 Encounters:  02/04/15 126 lb 4.8 oz (57.289 kg)    Wt Readings  from Last 10 Encounters:  02/04/15 126 lb 4.8 oz (57.289 kg)  08/16/08 134 lb 6.1 oz (60.955 kg)  07/28/08 133 lb (60.328 kg)  07/13/08 131 lb 2.1 oz (59.481 kg)  04/05/08 125 lb 4 oz (56.813 kg)  03/28/08 122 lb 2.1 oz (55.398 kg)     BMI:  Body mass index is 21.67 kg/(m^2).  Estimated Nutritional Needs:   Kcal:  BEE: 1278kcals, TEE: (IF 1.1-1.3)(AF 1.3) 1813-2142kcals  Protein:  57-69g protein (1.0-1.2g/kg)  Fluid:  1473-1767mL of fluid  (25-20mL/kg)   HIGH Care Level  Dwyane Luo, RD, LDN Pager [phone]

## 2015-02-07 NOTE — Pre-Procedure Instructions (Signed)
Patient is to remain NPO for 8 hours after swallowing Pillcam.  Verbal order read back Dr. Elliott/B. Juliane Lack, RN

## 2015-02-07 NOTE — Consult Note (Signed)
Patient drank prep and doing capsule endoscopy today.  I had a long discussion of causes for her abd pain including IBS and possible adhesions being the most likely causes if her capsule is neg.   Discussed the GI tract nerves and muscles dysfunctioning in response to stress and how some meds can help.  Discussed unusual report of CT and will ask for over read. Will recommend change from iv antibiotics to oral and she may be able to go home tomorrow or Thursday.  Capsule will be read tomorrow afternoon.

## 2015-02-08 ENCOUNTER — Encounter: Payer: Self-pay | Admitting: Unknown Physician Specialty

## 2015-02-08 LAB — CBC
HEMATOCRIT: 38.2 % (ref 35.0–47.0)
Hemoglobin: 12.5 g/dL (ref 12.0–16.0)
MCH: 30.1 pg (ref 26.0–34.0)
MCHC: 32.7 g/dL (ref 32.0–36.0)
MCV: 92 fL (ref 80.0–100.0)
Platelets: 256 10*3/uL (ref 150–440)
RBC: 4.15 MIL/uL (ref 3.80–5.20)
RDW: 13.7 % (ref 11.5–14.5)
WBC: 7.7 10*3/uL (ref 3.6–11.0)

## 2015-02-08 LAB — BASIC METABOLIC PANEL
Anion gap: 4 — ABNORMAL LOW (ref 5–15)
BUN: <5 mg/dL — ABNORMAL LOW (ref 6–20)
CALCIUM: 8.6 mg/dL — AB (ref 8.9–10.3)
CO2: 28 mmol/L (ref 22–32)
CREATININE: 0.84 mg/dL (ref 0.44–1.00)
Chloride: 110 mmol/L (ref 101–111)
GFR calc non Af Amer: >60 mL/min (ref >60)
GLUCOSE: 84 mg/dL (ref 65–99)
Potassium: 3.7 mmol/L (ref 3.5–5.1)
Sodium: 142 mmol/L (ref 135–145)

## 2015-02-08 MED ORDER — CIPROFLOXACIN HCL 500 MG PO TABS: 500.0000 mg | ORAL_TABLET | Freq: Two times a day (BID) | ORAL | Status: DC

## 2015-02-08 MED ORDER — METRONIDAZOLE 500 MG PO TABS: 500.0000 mg | ORAL_TABLET | Freq: Three times a day (TID) | ORAL | Status: DC

## 2015-02-08 MED ORDER — ONDANSETRON 4 MG PO TBDP: 4.0000 mg | ORAL_TABLET | Freq: Three times a day (TID) | ORAL | Status: DC | PRN

## 2015-02-08 NOTE — Plan of Care (Signed)
Problem: Pain Managment: Goal: General experience of comfort will improve Outcome: Progressing Pt c/o abdominal pain, initially did not ask for pain medication. Pain became 8/10 asked for morphine and Zofran, both resulting in relief.   Problem: Nutrition: Goal: Adequate nutrition will be maintained Outcome: Progressing Pt continues to have a poor appetite. Did not eat any dinner, takes sips of water and apple juice. Unable to tolerate Boost supplement drinks d/t being lactose intolerant.   Problem: Bowel/Gastric: Goal: Will not experience complications related to bowel motility Outcome: Progressing Pt has had more than 10 small, brown soft coffee ground like stools, flaky. Continue to assess.

## 2015-02-08 NOTE — Discharge Summary (Addendum)
[facility] [place] NAME: Candice Mccall    MR#:  [id number]  DATE OF BIRTH:  Aug 03, 1980  DATE OF ADMISSION:  02/03/2015 ADMITTING PHYSICIAN: Lytle Butte, MD  DATE OF DISCHARGE: 02/08/2015  PRIMARY CARE PHYSICIAN: Emogene Morgan MD   ADMISSION DIAGNOSIS:  Colitis [K52.9] RLQ abdominal pain [R10.31]  DISCHARGE DIAGNOSIS:  Principal Problem:   Colitis  SECONDARY DIAGNOSIS:   Past Medical History  Diagnosis Date  . Colitis     HOSPITAL COURSE:  34 y.o. female with a known history of colitis, admitted with abdominal pain. She states she has been dealing with symptoms of diarrhea alternating with constipation for some time approximately 2 months ago had a colonoscopy with findings concerning for Crohn's this was performed at [facility], followed by CAT scan findings consistent with thickening of the ileum. She also complained of right lower quadrant pain intermittent over aggressively worsening in both duration and intensity, currently described as "pain" intensity 7/10 no worsening or relieving factors nonradiating. Associated dark tarry stool 2, subjective chills but no fever.  Please see Dr. Samantha Crimes dictated history and physical for further details.  Patient was started on steroids initially and GI consultation was obtained with Dr. Vira Agar.  He is recommended to continue ciprofloxacin and Flagyl with capsule endoscopy which was performed on 22nd of November results of which are pending. Patient tolerated the procedure well.  She has also been tolerating liquid diet.  Her pain has been manageable without narcotic pain medication and she did not want to go home on narcotics.  She does have a poor appetite.  But really wants to go home even though she has had more than 10 small brown soft coffee ground-like stool yesterday.  She is otherwise and hemodynamically stable and will follow up with her GI physician, until next  Tuesday as scheduled.  She is agreeable with discharge plans. DISCHARGE CONDITIONS:  Stable  CONSULTS OBTAINED:  Treatment Team:  Manya Silvas, MD DRUG ALLERGIES:   Allergies  Allergen Reactions  . Lactose Intolerance (Gi) Other (See Comments)    Unable to tolerate lactose products  . Penicillins Hives and Other (See Comments)    Has patient had a PCN reaction causing immediate rash, facial/tongue/throat swelling, SOB or lightheadedness with hypotension: No Has patient had a PCN reaction causing severe rash involving mucus membranes or skin necrosis: No Has patient had a PCN reaction that required hospitalization No Has patient had a PCN reaction occurring within the last 10 years: No If all of the above answers are "NO", then may proceed with Cephalosporin use.   . Sulfa Antibiotics Rash and Other (See Comments)    Joint pain    DISCHARGE MEDICATIONS:   Current Discharge Medication List    START taking these medications   Details  ciprofloxacin (CIPRO) 500 MG tablet Take 1 tablet (500 mg total) by mouth 2 (two) times daily. Qty: 12 tablet, Refills: 0    metroNIDAZOLE (FLAGYL) 500 MG tablet Take 1 tablet (500 mg total) by mouth every 8 (eight) hours. Qty: 18 tablet, Refills: 0    ondansetron (ZOFRAN ODT) 4 MG disintegrating tablet Take 1 tablet (4 mg total) by mouth every 8 (eight) hours as needed for nausea or vomiting. Qty: 20 tablet, Refills: 0      CONTINUE these medications which have NOT CHANGED   Details  DULoxetine (CYMBALTA) 30 MG capsule Take 30 mg by mouth daily.    predniSONE (DELTASONE)  20 MG tablet Take 20 mg by mouth as directed. Take 20mg  for one more week, then taper dose down       DISCHARGE INSTRUCTIONS:    DIET:  Clear liquid diet, advance as tolerated  DISCHARGE CONDITION:  Good  ACTIVITY:  Activity as tolerated  OXYGEN:  Home Oxygen: No.   Oxygen Delivery: room air  DISCHARGE LOCATION:  home   If you experience worsening of  your admission symptoms, develop shortness of breath, life threatening emergency, suicidal or homicidal thoughts you must seek medical attention immediately by calling 911 or calling your MD immediately  if symptoms less severe.  You Must read complete instructions/literature along with all the possible adverse reactions/side effects for all the Medicines you take and that have been prescribed to you. Take any new Medicines after you have completely understood and accpet all the possible adverse reactions/side effects.   Please note  You were cared for by a hospitalist during your hospital stay. If you have any questions about your discharge medications or the care you received while you were in the hospital after you are discharged, you can call the unit and asked to speak with the hospitalist on call if the hospitalist that took care of you is not available. Once you are discharged, your primary care physician will handle any further medical issues. Please note that NO REFILLS for any discharge medications will be authorized once you are discharged, as it is imperative that you return to your primary care physician (or establish a relationship with a primary care physician if you do not have one) for your aftercare needs so that they can reassess your need for medications and monitor your lab values.    On the day of Discharge: VITAL SIGNS:  Blood pressure 103/68, pulse 66, temperature 98.5 F (36.9 C), temperature source Oral, resp. rate 18, height 5\' 4"  (1.626 m), weight 56.518 kg (124 lb 9.6 oz), last menstrual period 01/15/2015, SpO2 99 %. PHYSICAL EXAMINATION:  GENERAL:  34 y.o.-year-old patient lying in the bed with no acute distress.  EYES: Pupils equal, round, reactive to light and accommodation. No scleral icterus. Extraocular muscles intact.  HEENT: Head atraumatic, normocephalic. Oropharynx and nasopharynx clear.  NECK:  Supple, no jugular venous distention. No thyroid enlargement, no  tenderness.  LUNGS: Normal breath sounds bilaterally, no wheezing, rales,rhonchi or crepitation. No use of accessory muscles of respiration.  CARDIOVASCULAR: S1, S2 normal. No murmurs, rubs, or gallops.  ABDOMEN: Soft, non-tender, non-distended. Bowel sounds present. No organomegaly or mass.  EXTREMITIES: No pedal edema, cyanosis, or clubbing.  NEUROLOGIC: Cranial nerves II through XII are intact. Muscle strength 5/5 in all extremities. Sensation intact. Gait not checked.  PSYCHIATRIC: The patient is alert and oriented x 3.  SKIN: No obvious rash, lesion, or ulcer.  DATA REVIEW:   CBC  Recent Labs Lab 02/08/15 0435  WBC 7.7  HGB 12.5  HCT 38.2  PLT 256    Chemistries   Recent Labs Lab 02/07/15 0425 02/08/15 0435  NA 144 142  K 3.5 3.7  CL 111 110  CO2 27 28  GLUCOSE 94 84  BUN <5* <5*  CREATININE 0.82 0.84  CALCIUM 8.2* 8.6*  AST 15  --   ALT 10*  --   ALKPHOS 49  --   BILITOT 0.4  --      Management plans discussed with the patient, family and they are in agreement.  CODE STATUS: Full code  TOTAL TIME TAKING CARE OF THIS  PATIENT: 55 minutes.    [facility], Makisha Marrin M.D on 02/08/2015 at 10:19 AM  Between 7am to 6pm - Pager - [phone]  After 6pm go to [email or web address] - password [email or web address]  Office  [phone]  CC: Primary care physician; Emogene Morgan MD Narda Bonds MD Manya Silvas, MD

## 2015-02-08 NOTE — Discharge Instructions (Signed)
°  Colitis °Colitis is inflammation of the colon. Colitis may last a short time (acute) or it may last a long time (chronic). °CAUSES °This condition may be caused by: °· Viruses. °· Bacteria. °· Reactions to medicine. °· Certain autoimmune diseases, such as Crohn disease or ulcerative colitis. °SYMPTOMS °Symptoms of this condition include: °· Diarrhea. °· Passing bloody or tarry stool. °· Pain. °· Fever. °· Vomiting. °· Tiredness (fatigue). °· Weight loss. °· Bloating. °· Sudden increase in abdominal pain. °· Having fewer bowel movements than usual. °DIAGNOSIS °This condition is diagnosed with a stool test or a blood test. You may also have other tests, including X-rays, a CT scan, or a colonoscopy. °TREATMENT °Treatment may include: °· Resting the bowel. This involves not eating or drinking for a period of time. °· Fluids that are given through an IV tube. °· Medicine for pain and diarrhea. °· Antibiotic medicines. °· Cortisone medicines. °· Surgery. °HOME CARE INSTRUCTIONS °Eating and Drinking °· Follow instructions from your health care provider about eating or drinking restrictions. °· Drink enough fluid to keep your urine clear or pale yellow. °· Work with a dietitian to determine which foods cause your condition to flare up. °· Avoid foods that cause flare-ups. °· Eat a well-balanced diet. °Medicines °· Take over-the-counter and prescription medicines only as told by your health care provider. °· If you were prescribed an antibiotic medicine, take it as told by your health care provider. Do not stop taking the antibiotic even if you start to feel better. °General Instructions °· Keep all follow-up visits as told by your health care provider. This is important. °SEEK MEDICAL CARE IF: °· Your symptoms do not go away. °· You develop new symptoms. °SEEK IMMEDIATE MEDICAL CARE IF: °· You have a fever that does not go away with treatment. °· You develop chills. °· You have extreme weakness, fainting, or  dehydration. °· You have repeated vomiting. °· You develop severe pain in your abdomen. °· You pass bloody or tarry stool. °  °This information is not intended to replace advice given to you by your health care provider. Make sure you discuss any questions you have with your health care provider. °  °Document Released: 04/11/2004 Document Revised: 11/23/2014 Document Reviewed: 06/27/2014 °Elsevier Interactive Patient Education ©2016 [employer] ° °

## 2015-02-08 NOTE — Progress Notes (Addendum)
Patient ate some applesauce this morning. Okay to be discharged home per MD. IV removed.  Discharge instructions given to patient, follow up care, diet, activity, and medicines reviewed with the patient. All questions answered. Patient verbalized understanding. Husband to transport patient home.  Patient left via wheelchair with auxiliary and family.

## 2015-03-19 DIAGNOSIS — K529 Noninfective gastroenteritis and colitis, unspecified: Secondary | ICD-10-CM

## 2015-03-19 HISTORY — DX: Noninfective gastroenteritis and colitis, unspecified: K52.9

## 2015-05-26 ENCOUNTER — Inpatient Hospital Stay: Admission: RE | Admit: 2015-05-26 | Payer: Self-pay | Source: Ambulatory Visit

## 2015-05-26 ENCOUNTER — Encounter: Payer: Self-pay | Admitting: *Deleted

## 2015-05-26 NOTE — Patient Instructions (Signed)
  Your procedure is scheduled on: 06-02-15 Report to [place] To find out your arrival time please call [phone] between 1PM - 3PM on 06-01-15  Remember: Instructions that are not followed completely may result in serious medical risk, up to and including death, or upon the discretion of your surgeon and anesthesiologist your surgery may need to be rescheduled.    _X 1. Do not eat food or drink liquids after midnight. No gum chewing or hard candies.     _X__ 2. No Alcohol for 24 hours before or after surgery.   ____ 3. Bring all medications with you on the day of surgery if instructed.    ____ 4. Notify your doctor if there is any change in your medical condition     (cold, fever, infections).     Do not wear jewelry, make-up, hairpins, clips or nail polish.  Do not wear lotions, powders, or perfumes. You may wear deodorant.  Do not shave 48 hours prior to surgery. Men may shave face and neck.  Do not bring valuables to the hospital.    [facility] is not responsible for any belongings or valuables.               Contacts, dentures or bridgework may not be worn into surgery.  Leave your suitcase in the car. After surgery it may be brought to your room.  For patients admitted to the hospital, discharge time is determined by your reatment team.   Patients discharged the day of surgery will not be allowed to drive home.   Please read over the following fact sheets that you were given:     ____ Take these medicines the morning of surgery with A SIP OF WATER:    1. NONE  2.   3.   4.  5.  6.  ____ Fleet Enema (as directed)   ____ Use CHG Soap as directed  ____ Use inhalers on the day of surgery  ____ Stop metformin 2 days prior to surgery    ____ Take 1/2 of usual insulin dose the night before surgery and none on the morning of surgery.   ____ Stop Coumadin/Plavix/aspirin-N/A  ____ Stop Anti-inflammatories-NO NSAIDS OR ASA  PRODUCTS-TYLENOL OK TO TAKE   ____ Stop supplements until after surgery.    ____ Bring C-Pap to the hospital.

## 2015-06-02 ENCOUNTER — Ambulatory Visit
Admission: RE | Admit: 2015-06-02 | Discharge: 2015-06-02 | Disposition: A | Discharge status: Home / Self Care | Payer: BLUE CROSS/BLUE SHIELD | Source: Ambulatory Visit | Attending: Obstetrics and Gynecology | Admitting: Obstetrics and Gynecology

## 2015-06-02 ENCOUNTER — Encounter
Admission: RE | Disposition: A | Discharge status: Home / Self Care | Payer: Self-pay | Source: Ambulatory Visit | Attending: Obstetrics and Gynecology

## 2015-06-02 ENCOUNTER — Ambulatory Visit: Payer: BLUE CROSS/BLUE SHIELD | Admitting: Anesthesiology

## 2015-06-02 ENCOUNTER — Encounter: Payer: Self-pay | Admitting: *Deleted

## 2015-06-02 DIAGNOSIS — Z880 Allergy status to penicillin: Secondary | ICD-10-CM | POA: Insufficient documentation

## 2015-06-02 DIAGNOSIS — R102 Pelvic and perineal pain: Secondary | ICD-10-CM | POA: Diagnosis present

## 2015-06-02 DIAGNOSIS — Z801 Family history of malignant neoplasm of trachea, bronchus and lung: Secondary | ICD-10-CM | POA: Diagnosis not present

## 2015-06-02 DIAGNOSIS — G8929 Other chronic pain: Secondary | ICD-10-CM | POA: Insufficient documentation

## 2015-06-02 DIAGNOSIS — Z9889 Other specified postprocedural states: Secondary | ICD-10-CM

## 2015-06-02 DIAGNOSIS — Z882 Allergy status to sulfonamides status: Secondary | ICD-10-CM | POA: Diagnosis not present

## 2015-06-02 DIAGNOSIS — R1031 Right lower quadrant pain: Secondary | ICD-10-CM | POA: Insufficient documentation

## 2015-06-02 DIAGNOSIS — Z833 Family history of diabetes mellitus: Secondary | ICD-10-CM | POA: Diagnosis not present

## 2015-06-02 DIAGNOSIS — Z8249 Family history of ischemic heart disease and other diseases of the circulatory system: Secondary | ICD-10-CM | POA: Diagnosis not present

## 2015-06-02 DIAGNOSIS — Z79899 Other long term (current) drug therapy: Secondary | ICD-10-CM | POA: Diagnosis not present

## 2015-06-02 HISTORY — PX: CYSTOSCOPY: SHX5120

## 2015-06-02 HISTORY — DX: Anxiety disorder, unspecified: F41.9

## 2015-06-02 HISTORY — PX: LAPAROSCOPY: SHX197

## 2015-06-02 LAB — POCT PREGNANCY, URINE: Preg Test, Ur: NEGATIVE

## 2015-06-02 SURGERY — LAPAROSCOPY, DIAGNOSTIC
Anesthesia: General | Wound class: Clean Contaminated

## 2015-06-02 MED ORDER — FENTANYL CITRATE (PF) 100 MCG/2ML IJ SOLN
25.0000 ug | INTRAMUSCULAR | Status: DC | PRN
  Administered 2015-06-02 (×4): 25 ug via INTRAVENOUS

## 2015-06-02 MED ORDER — ONDANSETRON HCL 4 MG/2ML IJ SOLN
INTRAMUSCULAR | Status: DC | PRN
  Administered 2015-06-02: 4 mg via INTRAVENOUS

## 2015-06-02 MED ORDER — FENTANYL CITRATE (PF) 100 MCG/2ML IJ SOLN
INTRAMUSCULAR | Status: AC
  Administered 2015-06-02: 25 ug via INTRAVENOUS
  Filled 2015-06-02: qty 2

## 2015-06-02 MED ORDER — BUPIVACAINE HCL (PF) 0.5 % IJ SOLN
INTRAMUSCULAR | Status: AC
  Filled 2015-06-02: qty 30

## 2015-06-02 MED ORDER — FAMOTIDINE 20 MG PO TABS
ORAL_TABLET | ORAL | Status: AC
  Administered 2015-06-02: 20 mg via ORAL
  Filled 2015-06-02: qty 1

## 2015-06-02 MED ORDER — OXYCODONE-ACETAMINOPHEN 5-325 MG PO TABS: 1.0000 | ORAL_TABLET | Freq: Four times a day (QID) | ORAL | Status: DC | PRN

## 2015-06-02 MED ORDER — CHLORHEXIDINE GLUCONATE 4 % EX LIQD: Freq: Once | CUTANEOUS | Status: DC

## 2015-06-02 MED ORDER — DEXAMETHASONE SODIUM PHOSPHATE 10 MG/ML IJ SOLN
INTRAMUSCULAR | Status: DC | PRN
  Administered 2015-06-02: 5 mg via INTRAVENOUS

## 2015-06-02 MED ORDER — PROPOFOL 10 MG/ML IV BOLUS
INTRAVENOUS | Status: DC | PRN
  Administered 2015-06-02: 130 mg via INTRAVENOUS

## 2015-06-02 MED ORDER — MIDAZOLAM HCL 2 MG/2ML IJ SOLN
INTRAMUSCULAR | Status: DC | PRN
  Administered 2015-06-02: 2 mg via INTRAVENOUS

## 2015-06-02 MED ORDER — IBUPROFEN 600 MG PO TABS: 600.0000 mg | ORAL_TABLET | Freq: Four times a day (QID) | ORAL | Status: DC | PRN

## 2015-06-02 MED ORDER — FAMOTIDINE 20 MG PO TABS
20.0000 mg | ORAL_TABLET | Freq: Once | ORAL | Status: AC
  Administered 2015-06-02: 20 mg via ORAL

## 2015-06-02 MED ORDER — EPHEDRINE SULFATE 50 MG/ML IJ SOLN
INTRAMUSCULAR | Status: DC | PRN
  Administered 2015-06-02: 10 mg via INTRAVENOUS

## 2015-06-02 MED ORDER — FENTANYL CITRATE (PF) 100 MCG/2ML IJ SOLN
INTRAMUSCULAR | Status: DC | PRN
  Administered 2015-06-02: 50 ug via INTRAVENOUS

## 2015-06-02 MED ORDER — PHENYLEPHRINE HCL 10 MG/ML IJ SOLN
INTRAMUSCULAR | Status: DC | PRN
  Administered 2015-06-02 (×2): 100 ug via INTRAVENOUS

## 2015-06-02 MED ORDER — LACTATED RINGERS IV SOLN
INTRAVENOUS | Status: DC
  Administered 2015-06-02: 50 mL/h via INTRAVENOUS

## 2015-06-02 MED ORDER — ONDANSETRON HCL 4 MG/2ML IJ SOLN
4.0000 mg | Freq: Once | INTRAMUSCULAR | Status: DC | PRN
Start: 2015-06-02 — End: 2015-06-02

## 2015-06-02 MED ORDER — BUPIVACAINE HCL 0.5 % IJ SOLN
INTRAMUSCULAR | Status: DC | PRN
  Administered 2015-06-02: 5 mL

## 2015-06-02 MED ORDER — ROCURONIUM BROMIDE 100 MG/10ML IV SOLN
INTRAVENOUS | Status: DC | PRN
  Administered 2015-06-02: 30 mg via INTRAVENOUS

## 2015-06-02 MED ORDER — LIDOCAINE HCL (CARDIAC) 20 MG/ML IV SOLN
INTRAVENOUS | Status: DC | PRN
  Administered 2015-06-02: 100 mg via INTRAVENOUS

## 2015-06-02 MED ORDER — SUGAMMADEX SODIUM 200 MG/2ML IV SOLN
INTRAVENOUS | Status: DC | PRN
  Administered 2015-06-02: 125 mg via INTRAVENOUS

## 2015-06-02 SURGICAL SUPPLY — 39 items
BAG URO DRAIN 2000ML W/SPOUT (MISCELLANEOUS) ×2 IMPLANT
BLADE SURG SZ11 CARB STEEL (BLADE) ×2 IMPLANT
CANISTER SUCT 1200ML W/VALVE (MISCELLANEOUS) ×2 IMPLANT
CATH FOLEY 2WAY  5CC 16FR (CATHETERS) ×1
CATH ROBINSON RED A/P 16FR (CATHETERS) ×2 IMPLANT
CATH URTH 16FR FL 2W BLN LF (CATHETERS) ×1 IMPLANT
CHLORAPREP W/TINT 26ML (MISCELLANEOUS) ×2 IMPLANT
DRAPE UNDER BUTTOCK W/FLU (DRAPES) ×2 IMPLANT
DRSG TEGADERM 2-3/8X2-3/4 SM (GAUZE/BANDAGES/DRESSINGS) ×2 IMPLANT
DRSG TEGADERM 4X4.75 (GAUZE/BANDAGES/DRESSINGS) ×2 IMPLANT
GAUZE SPONGE NON-WVN 2X2 STRL (MISCELLANEOUS) ×1 IMPLANT
GLOVE BIO SURGEON STRL SZ7 (GLOVE) ×2 IMPLANT
GLOVE INDICATOR 7.5 STRL GRN (GLOVE) ×2 IMPLANT
GOWN STRL REUS W/ TWL LRG LVL3 (GOWN DISPOSABLE) ×2 IMPLANT
GOWN STRL REUS W/TWL LRG LVL3 (GOWN DISPOSABLE) ×2
IRRIGATION STRYKERFLOW (MISCELLANEOUS) ×1 IMPLANT
IRRIGATOR STRYKERFLOW (MISCELLANEOUS) ×2
IV LACTATED RINGERS 1000ML (IV SOLUTION) ×2 IMPLANT
KIT RM TURNOVER CYSTO AR (KITS) ×2 IMPLANT
LABEL OR SOLS (LABEL) ×2 IMPLANT
LIQUID BAND (GAUZE/BANDAGES/DRESSINGS) ×2 IMPLANT
NS IRRIG 1000ML POUR BTL (IV SOLUTION) ×2 IMPLANT
NS IRRIG 500ML POUR BTL (IV SOLUTION) ×2 IMPLANT
PACK GYN LAPAROSCOPIC (MISCELLANEOUS) ×2 IMPLANT
PAD OB MATERNITY 4.3X12.25 (PERSONAL CARE ITEMS) ×2 IMPLANT
PAD PREP 24X41 OB/GYN DISP (PERSONAL CARE ITEMS) ×2 IMPLANT
SCISSORS METZENBAUM CVD 33 (INSTRUMENTS) IMPLANT
SET CYSTO W/LG BORE CLAMP LF (SET/KITS/TRAYS/PACK) ×2 IMPLANT
SHEARS HARMONIC ACE PLUS 36CM (ENDOMECHANICALS) ×2 IMPLANT
SLEEVE ENDOPATH XCEL 5M (ENDOMECHANICALS) IMPLANT
SPONGE VERSALON 2X2 STRL (MISCELLANEOUS) ×1
SPONGE XRAY 4X4 16PLY STRL (MISCELLANEOUS) ×2 IMPLANT
SURGILUBE 2OZ TUBE FLIPTOP (MISCELLANEOUS) ×2 IMPLANT
SUT MNCRL AB 4-0 PS2 18 (SUTURE) ×2 IMPLANT
SUT VIC AB 0 CT2 27 (SUTURE) ×2 IMPLANT
SYRINGE 10CC LL (SYRINGE) ×2 IMPLANT
TROCAR ENDO BLADELESS 11MM (ENDOMECHANICALS) IMPLANT
TROCAR XCEL NON-BLD 5MMX100MML (ENDOMECHANICALS) ×2 IMPLANT
TUBING INSUFFLATOR HI FLOW (MISCELLANEOUS) ×2 IMPLANT

## 2015-06-02 NOTE — Discharge Instructions (Signed)
AMBULATORY SURGERY  DISCHARGE INSTRUCTIONS   1) The drugs that you were given will stay in your system until tomorrow so for the next 24 hours you should not:  A) Drive an automobile B) Make any legal decisions C) Drink any alcoholic beverage   2) You may resume regular meals tomorrow.  Today it is better to start with liquids and gradually work up to solid foods.  You may eat anything you prefer, but it is better to start with liquids, then soup and crackers, and gradually work up to solid foods.   3) Please notify your doctor immediately if you have any unusual bleeding, trouble breathing, redness and pain at the surgery site, drainage, fever, or pain not relieved by medication.    4) Additional Instructions:  May shower. Take dressings off in 2 days if desired.  No tub bathing.  Pelvic rest until office visit.  Please contact your physician with any problems or Same Day Surgery at [phone], Monday through Friday 6 am to 4 pm, or [place] [facility] number at [phone].

## 2015-06-02 NOTE — Anesthesia Procedure Notes (Signed)
Procedure Name: Intubation Date/Time: 06/02/2015 1:39 PM Performed by: Aline Brochure Pre-anesthesia Checklist: Patient identified, Emergency Drugs available, Suction available and Patient being monitored Patient Re-evaluated:Patient Re-evaluated prior to inductionOxygen Delivery Method: Circle system utilized Preoxygenation: Pre-oxygenation with 100% oxygen Intubation Type: IV induction Ventilation: Mask ventilation without difficulty Laryngoscope Size: Mac and 3 Grade View: Grade I Tube type: Oral Tube size: 7.0 mm Number of attempts: 1 Airway Equipment and Method: Patient positioned with wedge pillow and Stylet Placement Confirmation: ETT inserted through vocal cords under direct vision,  positive ETCO2 and breath sounds checked- equal and bilateral Secured at: 20 cm Tube secured with: Tape Dental Injury: Teeth and Oropharynx as per pre-operative assessment

## 2015-06-02 NOTE — Op Note (Signed)
Preoperative Diagnosis: 1) 35 y.o. chronic pelvic pain 2) Endometriosis Postoperative Diagnosis: 1) 35 y.o. chronic pelvic pain 2) No evidence of active endometriosis  Operation Performed: Diagnostic laparoscopy and cystoscopy  Indication: 35 y.o. with chronic pelvic pain, small lesion noted in posterior cul de sac at time of last laparoscopy with biopsy showing fibrosis.  Had remission of symptoms with her last pregnancy and in the immediate postpartum period but began having increasing right lower quadrant pain again.  Normal ureteral orifices, normal bladder dome.  Anesthesia: General  Preoperative Antibiotics: none  Estimated Blood Loss: minimal  IV Fluids: 656mL  Urine Output:: ~5mL  Drains or Tubes: none  Implants: none  Specimens Removed: none  Complications: none  Intraoperative Findings: Normal tubes, ovaries, and uterus.  The ureters were normal in course and caliber.  There was no evidence of ventral or inguinal hernia.  No adhesive disease.  There was a small amount of physiologic apearing serous fluid in the cul de sac.  The liver, gallbladder, and appendix were visualized and appeared grossly normal.  There was no evidence of endometriosis in the anterior cul de sac, posterior cul des sac, or the ovarian fossas.    Patient Condition: stable  Procedure in Detail:  Patient was taken to the operating room where she was administered general anesthesia.  She was positioned in the dorsal lithotomy position utilizing Allen stirups, prepped and draped in the usual sterile fashion.  Prior to proceeding with procedure a time out was performed.  Attention was turned to the patient's pelvis.  A red rubber catheter was used to empty the patient's bladder.  An operative speculum was placed to allow visualization of the cervix.  The anterior lip of the cervix was grasped with a single tooth tenaculum, and a Hulka tenaculum was placed to allow manipulation of the uterus.  The  operative speculum and single tooth tenaculum were then removed.  Attention was turned to the patient's abdomen.  The umbilicus was infiltrated with 1% Sensorcaine, before making a stab incision using an 11 blade scalpel.  A 40mm Excel trocar was then used to gain direct entry into the peritoneal cavity utilizing the camera to visualize progress of the trocar during placement.  Once peritoneal entry had been achieved, insufflation was started and pneumoperitoneum established at a pressure of 64mmHg.   The patient old left lower quadrant incision trocar site was infiltrated with 1% Sensorcaine and a stab incision was made using an 11 blade scalpel.  A second 60mm falope excel trocar was place this incision under direct visualization. Inspection of the abdomen and pelvis noted the above findings with no clearly discernable etiology for the patient's pain.  Pneumoperitoneum was evacuated.  The trocars were removed.  All trocar sites were then dressed with surgical skin glue.  Attention was turned again to the patient's pelvis.  The Hulka tenaculum was removed.  Cystoscopy was performed using a 30 degree scope nothing normal ureteral orifices, normal bladder dome.  There was no evidence of Hunner's ulcers or other lesions involving the bladder mucosa.  Sponge needle and instrument counts were correct time two.  The patient tolerated the procedure well and was taken to the recovery room in stable condition.

## 2015-06-02 NOTE — Transfer of Care (Signed)
Immediate Anesthesia Transfer of Care Note  Patient: Candice Mccall  Procedure(s) Performed: Procedure(s): LAPAROSCOPY DIAGNOSTIC (N/A) CYSTOSCOPY (N/A)  Patient Location: PACU  Anesthesia Type:General  Level of Consciousness: awake  Airway & Oxygen Therapy: Patient Spontanous Breathing and Patient connected to face mask oxygen  Post-op Assessment: Report given to RN and Post -op Vital signs reviewed and stable  Post vital signs: stable  Last Vitals:  Filed Vitals:   06/02/15 1243 06/02/15 1426  BP: 113/73   Pulse: 73   Temp: 36.8 C   Resp: 16 17    Complications: No apparent anesthesia complications

## 2015-06-02 NOTE — Anesthesia Preprocedure Evaluation (Signed)
Anesthesia Evaluation  Patient identified by MRN, date of birth, ID band Patient awake    Reviewed: Allergy & Precautions, NPO status , Patient's Chart, lab work & pertinent test results  Airway Mallampati: I       Dental  (+) Teeth Intact   Pulmonary neg pulmonary ROS,    Pulmonary exam normal        Cardiovascular negative cardio ROS   Rhythm:Regular     Neuro/Psych negative neurological ROS     GI/Hepatic negative GI ROS, Neg liver ROS,   Endo/Other  negative endocrine ROS  Renal/GU negative Renal ROS     Musculoskeletal negative musculoskeletal ROS (+)   Abdominal Normal abdominal exam  (+)   Peds negative pediatric ROS (+)  Hematology negative hematology ROS (+)   Anesthesia Other Findings   Reproductive/Obstetrics                             Anesthesia Physical Anesthesia Plan  ASA: I  Anesthesia Plan: General   Post-op Pain Management:    Induction: Intravenous  Airway Management Planned: Oral ETT  Additional Equipment:   Intra-op Plan:   Post-operative Plan: Extubation in OR  Informed Consent: I have reviewed the patients History and Physical, chart, labs and discussed the procedure including the risks, benefits and alternatives for the proposed anesthesia with the patient or authorized representative who has indicated his/her understanding and acceptance.     Plan Discussed with: CRNA  Anesthesia Plan Comments:         Anesthesia Quick Evaluation

## 2015-06-03 NOTE — H&P (Signed)
Paper H&P reviewed:  History reviewed, patient examined, no change in status, stable for surgery. 

## 2015-06-05 ENCOUNTER — Encounter: Payer: Self-pay | Admitting: Obstetrics and Gynecology

## 2015-06-05 NOTE — Anesthesia Postprocedure Evaluation (Signed)
Anesthesia Post Note  Patient: Candice Mccall  Procedure(s) Performed: Procedure(s) (LRB): LAPAROSCOPY DIAGNOSTIC (N/A) CYSTOSCOPY (N/A)  Patient location during evaluation: PACU Anesthesia Type: General Level of consciousness: awake Pain management: pain level controlled Vital Signs Assessment: post-procedure vital signs reviewed and stable Respiratory status: spontaneous breathing Cardiovascular status: blood pressure returned to baseline Anesthetic complications: yes    Last Vitals:  Filed Vitals:   06/02/15 1524 06/02/15 1603  BP: 114/65 110/81  Pulse: 62 81  Temp: 36.8 C   Resp: 16 16    Last Pain:  Filed Vitals:   06/02/15 1604  PainSc: 3                  VAN STAVEREN,Welborn Keena

## 2016-01-27 ENCOUNTER — Encounter: Payer: Self-pay | Admitting: Gynecology

## 2016-01-27 ENCOUNTER — Ambulatory Visit
Admission: EM | Admit: 2016-01-27 | Discharge: 2016-01-27 | Disposition: A | Discharge status: Home / Self Care | Payer: BLUE CROSS/BLUE SHIELD | Attending: Emergency Medicine | Admitting: Emergency Medicine

## 2016-01-27 DIAGNOSIS — J0141 Acute recurrent pansinusitis: Secondary | ICD-10-CM | POA: Diagnosis not present

## 2016-01-27 DIAGNOSIS — H6504 Acute serous otitis media, recurrent, right ear: Secondary | ICD-10-CM

## 2016-01-27 HISTORY — DX: Unspecified eustachian tube disorder, right ear: H69.91

## 2016-01-27 MED ORDER — MOMETASONE FUROATE 50 MCG/ACT NA SUSP: 2.0000 | Freq: Every day | NASAL | 0 refills | Status: DC

## 2016-01-27 MED ORDER — CEFUROXIME AXETIL 250 MG PO TABS: 250.0000 mg | ORAL_TABLET | Freq: Two times a day (BID) | ORAL | 0 refills | Status: AC

## 2016-01-27 NOTE — Discharge Instructions (Signed)
Discontinue Flonase, start the Nasonex. Start Mucinex D, continue saline nasal irrigation. Complete antibiotics. Start some probiotics while taking the antibiotics to help prevent yeast infections and diarrhea. Follow-up with [facility] as soon as possible.

## 2016-01-27 NOTE — ED Triage Notes (Signed)
Patient c/o sinus infection and right ear pain x over a week.

## 2016-01-27 NOTE — ED Provider Notes (Signed)
HPI  SUBJECTIVE:  Candice Mccall is a 35 y.o. female who presents with a recurrent sinus and right ear infection for the past 10 days. She states it started off with URI-like symptoms with nasal congestion, rhinorrhea, postnasal drip, scratchy throat. She now reports fevers Tmax 100, purulent nasal drainage, upper dental pain, sinus pain or pressure. She reports sharp right ear pain and fullness and decreased hearing. Symptoms are worse with bending over or lying down, no alleviating factors. She tried Milta Deiters med sinus rinse, OTC decongestants, antihistamines and Flonase. She denies allergy symptoms, otorrhea. She states that these sx are identical to previous sinus and ear infections that she gets every one or 2 months. She states that this is the sixth episode this year. She states that she usually responds well to Ceftin, last course was in early October. She has a past medical history of right eustachian tube dysfunction, sinusitis, status post sinus surgery. Also history of otitis media. No history of diabetes, hypertension. LMP: 10/18. Denies possibility being pregnant. ENT: [facility], PMD: Emogene Morgan, MD   Past Medical History:  Diagnosis Date  . Anxiety   . Colitis 2017   thought abdominal pain was colitis but it is not.  . Eustachian tube disorder, right     Past Surgical History:  Procedure Laterality Date  . CYSTOSCOPY N/A 06/02/2015   Procedure: CYSTOSCOPY;  Surgeon: Malachy Mood, MD;  Location: [facility] ORS;  Service: Gynecology;  Laterality: N/A;  . DIAGNOSTIC LAPAROSCOPY  2014   looked like endometriosis  . GIVENS CAPSULE STUDY N/A 02/07/2015   Procedure: GIVENS CAPSULE STUDY;  Surgeon: Manya Silvas, MD;  Location: [facility] ENDOSCOPY;  Service: Endoscopy;  Laterality: N/A;  . LAPAROSCOPY N/A 06/02/2015   Procedure: LAPAROSCOPY DIAGNOSTIC;  Surgeon: Malachy Mood, MD;  Location: [facility] ORS;  Service: Gynecology;  Laterality: N/A;    Family History  Problem Relation Age  of Onset  . Diabetes Neg Hx     Social History  Substance Use Topics  . Smoking status: Never Smoker  . Smokeless tobacco: Never Used  . Alcohol use No    No current facility-administered medications for this encounter.   Current Outpatient Prescriptions:  .  DULoxetine (CYMBALTA) 30 MG capsule, Take 30 mg by mouth every evening. , Disp: , Rfl:  .  traZODone (DESYREL) 50 MG tablet, Take 50 mg by mouth at bedtime., Disp: , Rfl:  .  cefUROXime (CEFTIN) 250 MG tablet, Take 1 tablet (250 mg total) by mouth 2 (two) times daily with a meal., Disp: 20 tablet, Rfl: 0 .  ibuprofen (ADVIL,MOTRIN) 600 MG tablet, Take 1 tablet (600 mg total) by mouth every 6 (six) hours as needed., Disp: 60 tablet, Rfl: 3 .  mometasone (NASONEX) 50 MCG/ACT nasal spray, Place 2 sprays into the nose daily., Disp: 17 g, Rfl: 0  Allergies  Allergen Reactions  . Lactose Intolerance (Gi) Other (See Comments)    Unable to tolerate lactose products  . Penicillins Hives and Other (See Comments)    Has patient had a PCN reaction causing immediate rash, facial/tongue/throat swelling, SOB or lightheadedness with hypotension: No Has patient had a PCN reaction causing severe rash involving mucus membranes or skin necrosis: No Has patient had a PCN reaction that required hospitalization No Has patient had a PCN reaction occurring within the last 10 years: No If all of the above answers are "NO", then may proceed with Cephalosporin use.   . Sulfa Antibiotics Rash and Other (See Comments)  Joint pain     ROS  As noted in HPI.   Physical Exam  BP 104/69 (BP Location: Left Arm)   Pulse 90   Temp 98.6 F (37 C) (Oral)   Resp 16   Ht 5\' 4"  (1.626 m)   Wt 120 lb (54.4 kg)   LMP 01/03/2016   SpO2 100%   BMI 20.60 kg/m   Constitutional: Well developed, well nourished, no acute distress Eyes:  EOMI, conjunctiva normal bilaterally HENT: Normocephalic, atraumatic,mucus membranes moist. Positive yellowish serous  fluid with air-fluid levels behind right TM. Left TM within normal limits. Positive nasal congestion worse on the right. Erythematous, swollen turbinates. Positive right-sided maxillary and frontal sinus tenderness. Positive postnasal drip, cobblestoning. Respiratory: Normal inspiratory effort Cardiovascular: Normal rate GI: nondistended skin: No rash, skin intact Musculoskeletal: no deformities Neurologic: Alert & oriented x 3, no focal neuro deficits Psychiatric: Speech and behavior appropriate   ED Course   Medications - No data to display  No orders of the defined types were placed in this encounter.   No results found for this or any previous visit (from the past 24 hour(s)). No results found.  ED Clinical Impression  Recurrent acute serous otitis media of right ear  Acute recurrent pansinusitis  ED Assessment/Plan  We'll send home with 10 days of Ceftin as she has responded well to this in the past, DC Flonase, start Nasonex, start Mucinex D. Continue saline nasal irrigation. Advised follow-up with her doctor at [facility].   Discussed  MDM, plan and followup with patient  Discussed sn/sx that should prompt return to the ED. Patient agrees with plan.   Meds ordered this encounter  Medications  . traZODone (DESYREL) 50 MG tablet    Sig: Take 50 mg by mouth at bedtime.  . mometasone (NASONEX) 50 MCG/ACT nasal spray    Sig: Place 2 sprays into the nose daily.    Dispense:  17 g    Refill:  0  . cefUROXime (CEFTIN) 250 MG tablet    Sig: Take 1 tablet (250 mg total) by mouth 2 (two) times daily with a meal.    Dispense:  20 tablet    Refill:  0    *This clinic note was created using [employer]. Therefore, there may be occasional mistakes despite careful proofreading.  ?   Melynda Ripple, MD 01/27/16 [phone]

## 2016-10-01 IMAGING — CT CT ABD-PELV W/ CM
2 of 4 series · 16 of 46 positions shown, 18 images · IV contrast (omnipaque)
Comparison: None.

CLINICAL DATA: Worsening right lower quadrant pain and melena for
several weeks.

EXAM:
CT ABDOMEN AND PELVIS WITH CONTRAST
TECHNIQUE: Multidetector CT imaging of the abdomen and pelvis was performed
using the standard protocol following bolus administration of
intravenous contrast.
CONTRAST:  75mL OMNIPAQUE IOHEXOL 300 MG/ML  SOLN

[Series 2: routine abd pel with · axial · 0.66mm/px · z∈[-632,-232]mm · 13 of 88 slices shown, 15 images]
[im 4/88  soft-tissue]
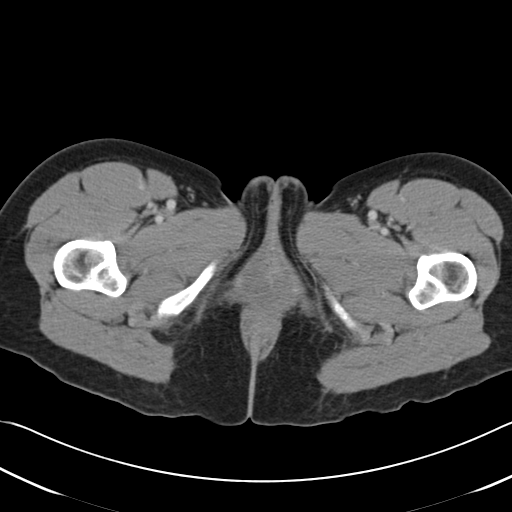
[im 4/88  bone]
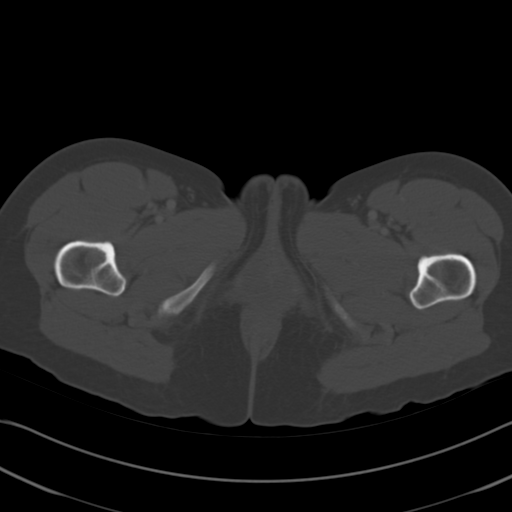
[im 11/88  soft-tissue]
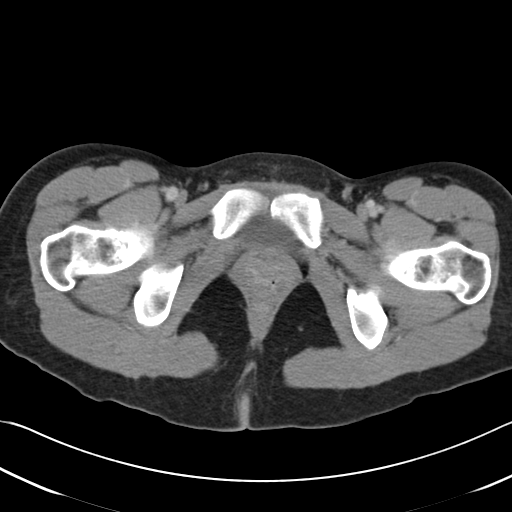
[im 18/88  soft-tissue]
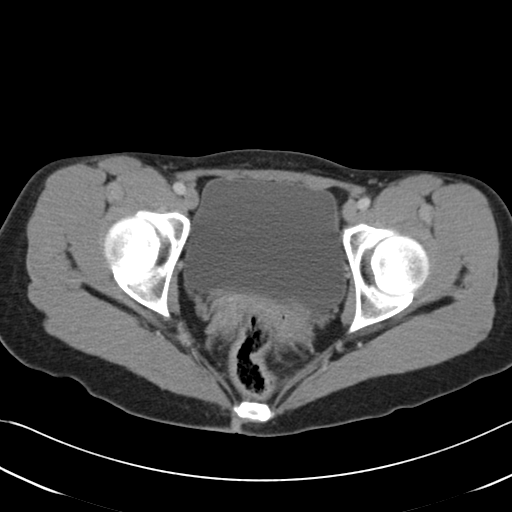
[im 25/88  soft-tissue]
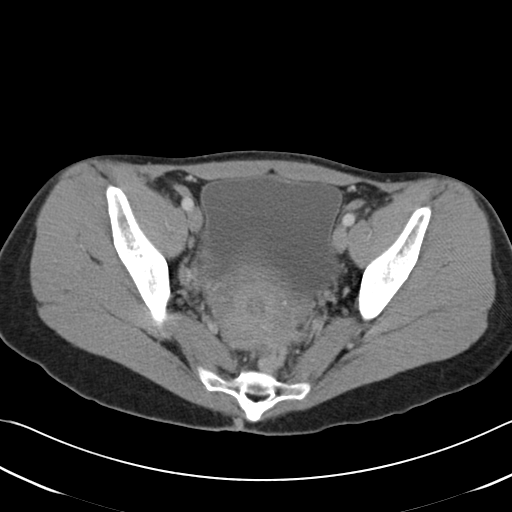
[im 32/88  soft-tissue]
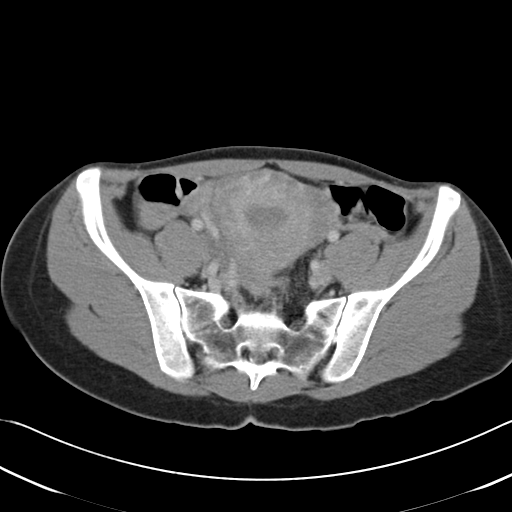
[im 39/88  soft-tissue]
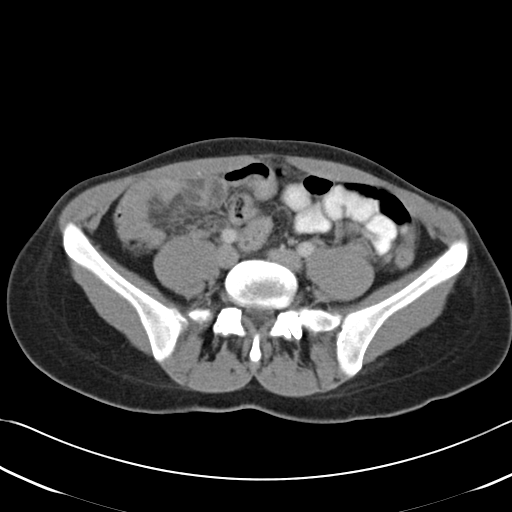
[im 46/88  soft-tissue]
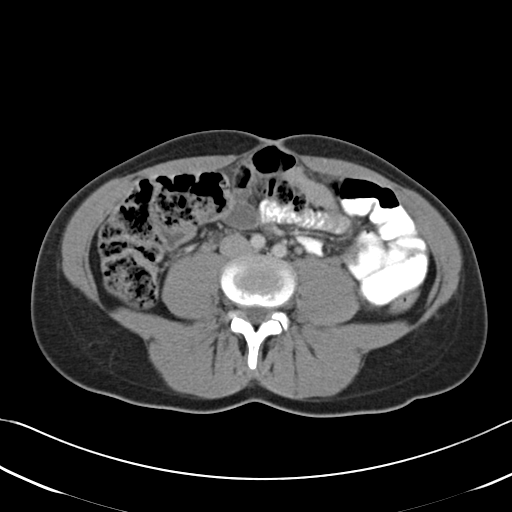
[im 49/88  soft-tissue]
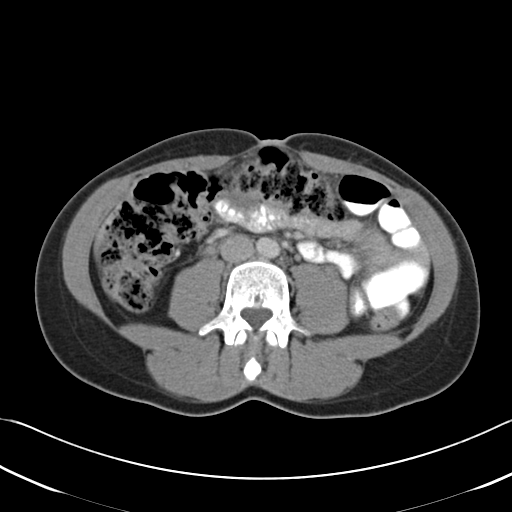
[im 56/88  soft-tissue]
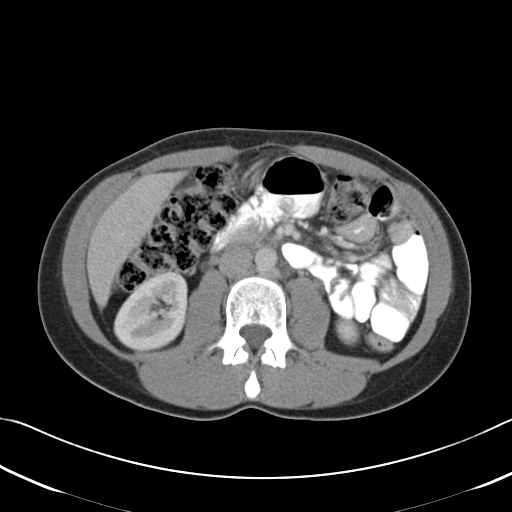
[im 56/88  bone]
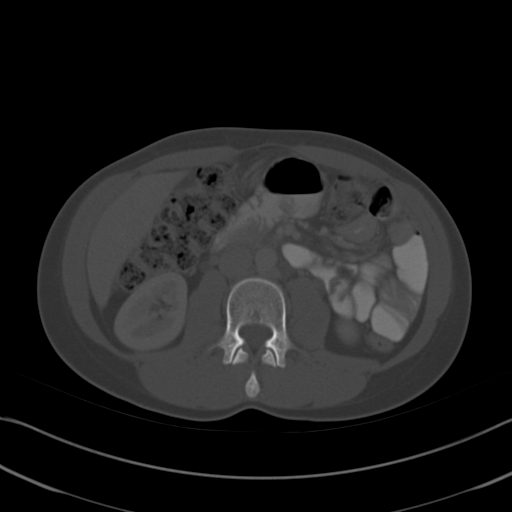
[im 63/88  soft-tissue]
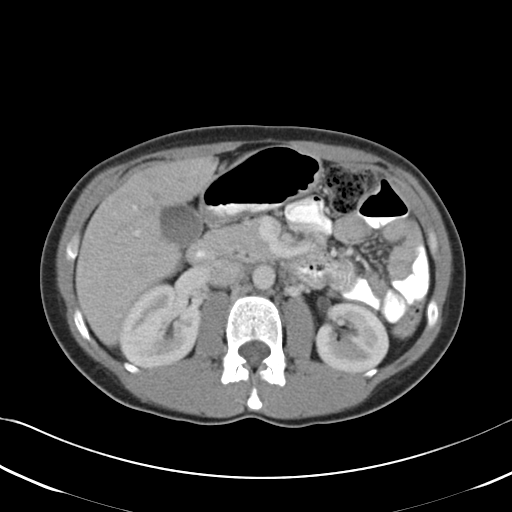
[im 70/88  soft-tissue]
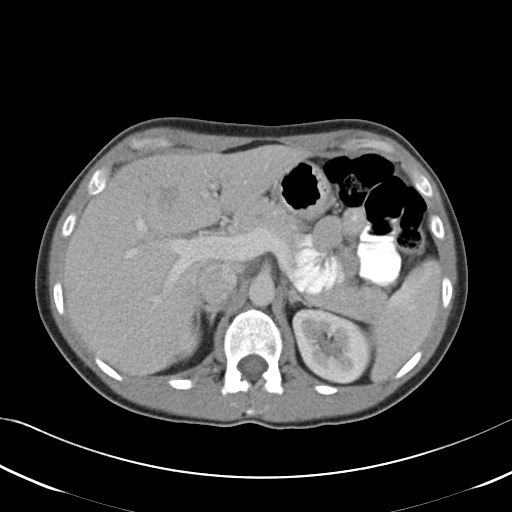
[im 77/88  soft-tissue]
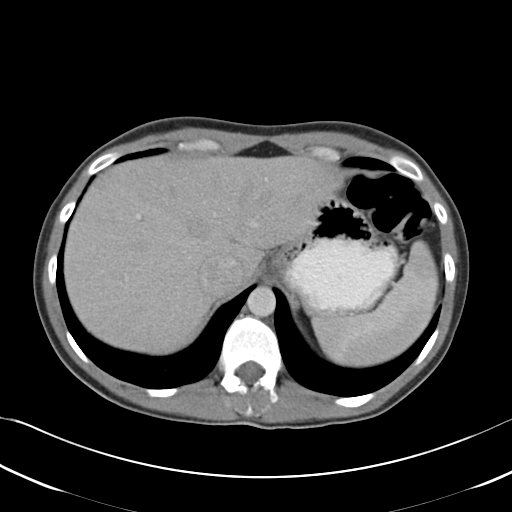
[im 84/88  soft-tissue]
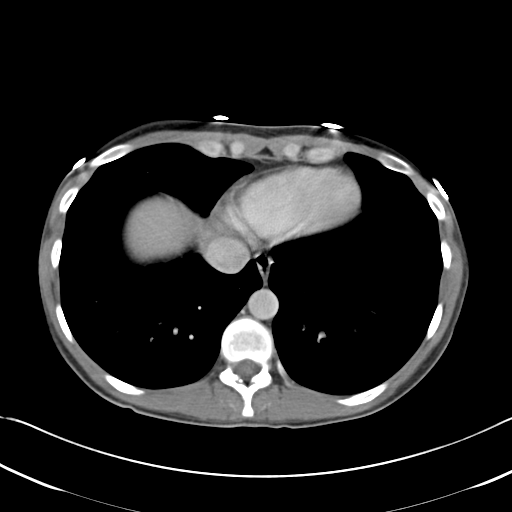

[Series 5: cor routine abd pel with · coronal · 0.59mm/px · 3 of 102 slices shown]
[im 34/102  soft-tissue]
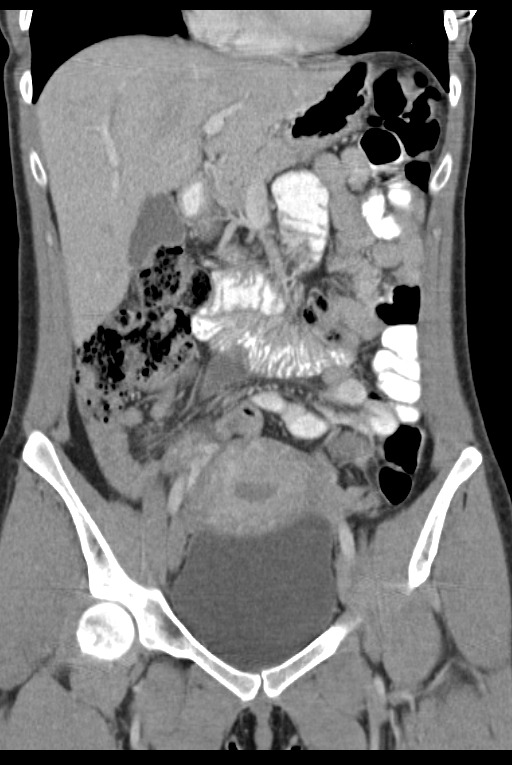
[im 45/102  soft-tissue]
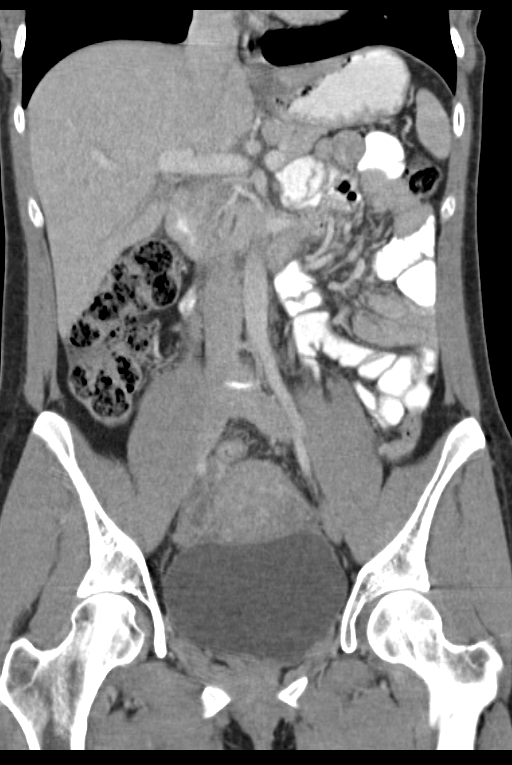
[im 57/102  soft-tissue]
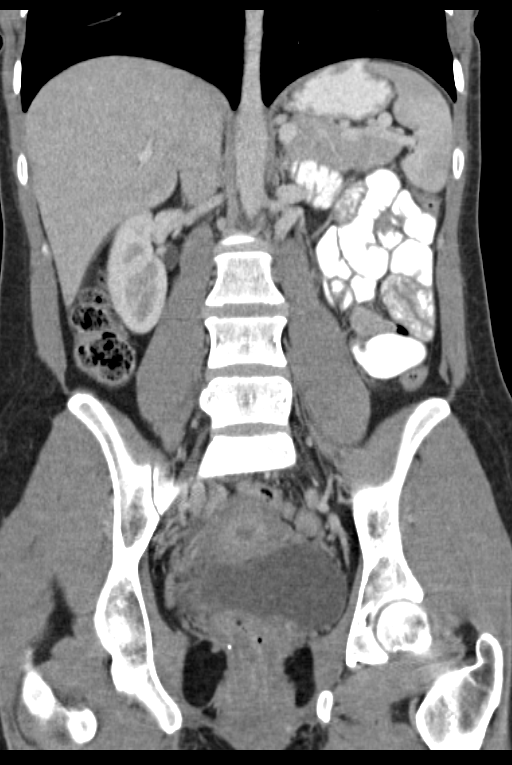

[16 of 46 positions shown; findings below may reference images not displayed]

FINDINGS: Lower chest:  No acute findings.

Hepatobiliary: No masses or other significant abnormality.
Gallbladder is unremarkable.

Pancreas: No mass, inflammatory changes, or other significant
abnormality.

Spleen: Within normal limits in size and appearance.

Adrenals/Urinary Tract: No masses identified. No evidence of
hydronephrosis.

Stomach/Bowel: Moderate stool noted in ascending and transverse
portions of colon. A nondilated loop of small bowel is seen within
the right lower quadrant which shows central mesenteric soft tissue
stranding on image 50/series 2. This raises suspicion for an
internal hernia. No evidence of bowel obstruction, ischemia, or
other complication.

Vascular/Lymphatic: No pathologically enlarged lymph nodes. No
evidence of abdominal aortic aneurysm.

Reproductive: No mass or other significant abnormality.

Other: None.

Musculoskeletal:  No suspicious bone lesions identified.
IMPRESSION: Possible internal small bowel hernia in the right lower quadrant. No
evidence of bowel obstruction, ischemia, or other acute findings.

## 2017-08-26 ENCOUNTER — Other Ambulatory Visit: Payer: Self-pay

## 2017-08-26 ENCOUNTER — Ambulatory Visit
Admission: EM | Admit: 2017-08-26 | Discharge: 2017-08-26 | Disposition: A | Discharge status: Home / Self Care | Payer: BLUE CROSS/BLUE SHIELD | Attending: Family Medicine | Admitting: Family Medicine

## 2017-08-26 ENCOUNTER — Encounter: Payer: Self-pay | Admitting: Emergency Medicine

## 2017-08-26 DIAGNOSIS — J069 Acute upper respiratory infection, unspecified: Secondary | ICD-10-CM

## 2017-08-26 MED ORDER — HYDROCOD POLST-CPM POLST ER 10-8 MG/5ML PO SUER: 5.0000 mL | Freq: Two times a day (BID) | ORAL | 0 refills | Status: DC

## 2017-08-26 MED ORDER — BENZONATATE 200 MG PO CAPS: ORAL_CAPSULE | ORAL | 0 refills | Status: DC

## 2017-08-26 NOTE — ED Triage Notes (Signed)
Patient states she developed a dry cough and fever over the weekend T max103. Patient states her fever is low grade today but her chest is painful from coughing

## 2017-08-26 NOTE — Discharge Instructions (Signed)
Drink Plenty of fluids. Use Tylenol or Motrin for fever and pain.  If you are worsening or not improving ,return to our clinic or go to your primary care physician

## 2017-08-26 NOTE — ED Provider Notes (Signed)
[facility]    CSN: [id number] Arrival date & time: 08/26/17  1124     History   Chief Complaint Chief Complaint  Patient presents with  . Fever    HPI Candice Mccall is a 37 y.o. female.   HPI  37 year old female presents with a nonproductive cough and fever that over the weekend reach the 103.  She had this for Saturday Sunday and yesterday it seemed to break.  Today she is afebrile 98.6.  States that her chest is painful from coughing but she always feels a pressure in the chest without coughing.  The pain will also radiate into her back.  O2 sats are 9 9% on room air.  She states that she has not had any mucus production.  He states that she does have exertional dyspnea.       Past Medical History:  Diagnosis Date  . Anxiety   . Colitis 2017   thought abdominal pain was colitis but it is not.  . Eustachian tube disorder, right     Patient Active Problem List   Diagnosis Date Noted  . Colitis 02/04/2015    Past Surgical History:  Procedure Laterality Date  . CYSTOSCOPY N/A 06/02/2015   Procedure: CYSTOSCOPY;  Surgeon: Malachy Mood, MD;  Location: [facility] ORS;  Service: Gynecology;  Laterality: N/A;  . DIAGNOSTIC LAPAROSCOPY  2014   looked like endometriosis  . GIVENS CAPSULE STUDY N/A 02/07/2015   Procedure: GIVENS CAPSULE STUDY;  Surgeon: Manya Silvas, MD;  Location: [facility] ENDOSCOPY;  Service: Endoscopy;  Laterality: N/A;  . LAPAROSCOPY N/A 06/02/2015   Procedure: LAPAROSCOPY DIAGNOSTIC;  Surgeon: Malachy Mood, MD;  Location: [facility] ORS;  Service: Gynecology;  Laterality: N/A;    OB History   None      Home Medications    Prior to Admission medications   Medication Sig Start Date End Date Taking? Authorizing Provider  DULoxetine (CYMBALTA) 30 MG capsule Take 30 mg by mouth every evening.    Yes [provider]  benzonatate (TESSALON) 200 MG capsule Take one cap TID PRN cough 08/26/17   Lorin Picket, PA-C    chlorpheniramine-HYDROcodone [facility]) 10-8 MG/5ML SUER Take 5 mLs by mouth 2 (two) times daily. 08/26/17   Lorin Picket, PA-C    Family History Family History  Problem Relation Age of Onset  . Diabetes Neg Hx     Social History Social History   Tobacco Use  . Smoking status: Never Smoker  . Smokeless tobacco: Never Used  Substance Use Topics  . Alcohol use: No  . Drug use: No     Allergies   Lactose intolerance (gi); Penicillins; and Sulfa antibiotics   Review of Systems Review of Systems  Constitutional: Positive for activity change. Negative for chills, fatigue and fever.  HENT: Negative for congestion, sinus pressure, sinus pain and sore throat.   Respiratory: Positive for cough and shortness of breath. Negative for wheezing and stridor.   All other systems reviewed and are negative.    Physical Exam Triage Vital Signs ED Triage Vitals [08/26/17 1134]  Enc Vitals Group     BP 114/78     Pulse Rate 91     Resp 16     Temp 98.6 F (37 C)     Temp src      SpO2 99 %     Weight 125 lb (56.7 kg)     Height 5\' 4"  (1.626 m)  Head Circumference      Peak Flow      Pain Score 6     Pain Loc      Pain Edu?      Excl. in [place]?    No data found.  Updated Vital Signs BP 114/78 (BP Location: Left Arm)   Pulse 91   Temp 98.6 F (37 C)   Resp 16   Ht 5\' 4"  (1.626 m)   Wt 125 lb (56.7 kg)   LMP 08/12/2017 (Approximate)   SpO2 99%   BMI 21.46 kg/m   Visual Acuity Right Eye Distance:   Left Eye Distance:   Bilateral Distance:    Right Eye Near:   Left Eye Near:    Bilateral Near:     Physical Exam  Constitutional: She is oriented to person, place, and time. She appears well-developed and well-nourished. No distress.  HENT:  Head: Normocephalic.  Right Ear: External ear normal.  Left Ear: External ear normal.  Nose: Nose normal.  Mouth/Throat: Oropharynx is clear and moist. No oropharyngeal exudate.  Eyes: Pupils are equal,  round, and reactive to light. Right eye exhibits no discharge. Left eye exhibits no discharge.  Neck: Normal range of motion.  Pulmonary/Chest: Effort normal and breath sounds normal.  Musculoskeletal: Normal range of motion.  Lymphadenopathy:    She has no cervical adenopathy.  Neurological: She is alert and oriented to person, place, and time.  Skin: Skin is warm and dry. She is not diaphoretic.  Psychiatric: She has a normal mood and affect. Her behavior is normal. Judgment and thought content normal.  Nursing note and vitals reviewed.    UC Treatments / Results  Labs (all labs ordered are listed, but only abnormal results are displayed) Labs Reviewed - No data to display  EKG None  Radiology No results found.  Procedures Procedures (including critical care time)  Medications Ordered in UC Medications - No data to display  Initial Impression / Assessment and Plan / UC Course  I have reviewed the triage vital signs and the nursing notes.  Pertinent labs & imaging results that were available during my care of the patient were reviewed by me and considered in my medical decision making (see chart for details).     Plan: 1. Test/x-ray results and diagnosis reviewed with patient 2. rx as per orders; risks, benefits, potential side effects reviewed with patient 3. Recommend supportive treatment with increased fluids and rest.  Tylenol or Motrin for body aches or fever.  I have advised the patient is likely a viral illness does not require antibiotics at this point.  She worsens she should return to our clinic or go to her primary care physician.  Provide her with cough suppressants. 4. F/u prn if symptoms worsen or don't improve  Final Clinical Impressions(s) / UC Diagnoses   Final diagnoses:  Upper respiratory tract infection, unspecified type     Discharge Instructions      Drink Plenty of fluids. Use Tylenol or Motrin for fever and pain.  If you are worsening or  not improving ,return to our clinic or go to your primary care physician   ED Prescriptions    Medication Sig Dispense Auth. Provider   benzonatate (TESSALON) 200 MG capsule Take one cap TID PRN cough 30 capsule Lorin Picket, PA-C   chlorpheniramine-HYDROcodone (TUSSIONEX PENNKINETIC ER) 10-8 MG/5ML SUER Take 5 mLs by mouth 2 (two) times daily. 115 mL Lorin Picket, PA-C     Controlled  Substance Prescriptions [place] Controlled Substance Registry consulted? Not Applicable   Lorin Picket, PA-C 08/26/17 1227

## 2020-04-28 ENCOUNTER — Other Ambulatory Visit: Payer: Self-pay

## 2020-04-28 ENCOUNTER — Ambulatory Visit
Admission: EM | Admit: 2020-04-28 | Discharge: 2020-04-28 | Disposition: A | Discharge status: Home / Self Care | Payer: BC Managed Care – PPO | Attending: Family Medicine | Admitting: Family Medicine

## 2020-04-28 ENCOUNTER — Encounter: Payer: Self-pay | Admitting: Emergency Medicine

## 2020-04-28 DIAGNOSIS — J0100 Acute maxillary sinusitis, unspecified: Secondary | ICD-10-CM

## 2020-04-28 MED ORDER — CEFUROXIME AXETIL 500 MG PO TABS: 500.0000 mg | ORAL_TABLET | Freq: Two times a day (BID) | ORAL | 0 refills | Status: DC

## 2020-04-28 NOTE — ED Triage Notes (Signed)
Patient c/o sinus congestion and pressure, facial pain and HAs that started on Monday.  Patient denies fevers.

## 2020-04-29 NOTE — ED Provider Notes (Signed)
[facility]    CSN: [id number] Arrival date & time: 04/28/20  1901 History   Chief Complaint Chief Complaint  Patient presents with  . Sinus Problem   HPI  40 year old female presents with the above complaint.  Symptoms started Monday. She reports sinus pressure/pain, congestion and right ear pain. No fever. 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 Has a history of sinusitis. No relieving factors. No other complaints or concerns at this time.  Past Medical History:  Diagnosis Date  . Anxiety   . Colitis 2017   thought abdominal pain was colitis but it is not.  . Eustachian tube disorder, right     Patient Active Problem List   Diagnosis Date Noted  . Colitis 02/04/2015    Past Surgical History:  Procedure Laterality Date  . CYSTOSCOPY N/A 06/02/2015   Procedure: CYSTOSCOPY;  Surgeon: Malachy Mood, MD;  Location: [facility] ORS;  Service: Gynecology;  Laterality: N/A;  . DIAGNOSTIC LAPAROSCOPY  2014   looked like endometriosis  . GIVENS CAPSULE STUDY N/A 02/07/2015   Procedure: GIVENS CAPSULE STUDY;  Surgeon: Manya Silvas, MD;  Location: [facility] ENDOSCOPY;  Service: Endoscopy;  Laterality: N/A;  . LAPAROSCOPY N/A 06/02/2015   Procedure: LAPAROSCOPY DIAGNOSTIC;  Surgeon: Malachy Mood, MD;  Location: [facility] ORS;  Service: Gynecology;  Laterality: N/A;    OB History   No obstetric history on file.      Home Medications    Prior to Admission medications   Medication Sig Start Date End Date Taking? Authorizing Provider  cefUROXime (CEFTIN) 500 MG tablet Take 1 tablet (500 mg total) by mouth 2 (two) times daily with a meal. 04/28/20  Yes Camara Rosander G, DO  DULoxetine (CYMBALTA) 30 MG capsule Take 30 mg by mouth every evening.    Yes [provider]  traZODone (DESYREL) 50 MG tablet TAKE 1-2 TABS AT BEDTIME AS NEEDED 04/07/20  Yes [provider]    Family History Family History  Problem Relation Age of Onset  .  Diabetes Neg Hx     Social History Social History   Tobacco Use  . Smoking status: Never Smoker  . Smokeless tobacco: Never Used  Vaping Use  . Vaping Use: Never used  Substance Use Topics  . Alcohol use: No  . Drug use: No     Allergies   Lactose intolerance (gi), Penicillins, and Sulfa antibiotics   Review of Systems Review of Systems  Constitutional: Negative for fever.  HENT: Positive for congestion, ear pain, sinus pressure and sinus pain.      Physical Exam Triage Vital Signs ED Triage Vitals  Enc Vitals Group     BP 04/28/20 1912 117/75     Pulse Rate 04/28/20 1912 73     Resp 04/28/20 1912 14     Temp 04/28/20 1912 98.2 F (36.8 C)     Temp Source 04/28/20 1912 Oral     SpO2 04/28/20 1912 100 %     Weight 04/28/20 1909 122 lb (55.3 kg)     Height 04/28/20 1909 5\' 4"  (1.626 m)     Head Circumference --      Peak Flow --      Pain Score 04/28/20 1909 6     Pain Loc --      Pain Edu? --      Excl. in [place]? --    No data found.  Updated Vital Signs BP 117/75 (BP Location: Right  Arm)   Pulse 73   Temp 98.2 F (36.8 C) (Oral)   Resp 14   Ht 5\' 4"  (1.626 m)   Wt 55.3 kg   LMP 04/26/2020 (Exact Date)   SpO2 100%   BMI 20.94 kg/m   Visual Acuity Right Eye Distance:   Left Eye Distance:   Bilateral Distance:    Right Eye Near:   Left Eye Near:    Bilateral Near:     Physical Exam Constitutional:      General: She is not in acute distress.    Appearance: Normal appearance. She is not ill-appearing.  HENT:     Head: Normocephalic and atraumatic.     Right Ear: Tympanic membrane normal.     Left Ear: Tympanic membrane normal.     Nose:     Comments: Right maxillary sinus tenderness to palpation. Eyes:     General:        Right eye: No discharge.        Left eye: No discharge.     Conjunctiva/sclera: Conjunctivae normal.  Cardiovascular:     Rate and Rhythm: Normal rate and regular rhythm.     Heart sounds: No murmur  heard.   Pulmonary:     Effort: Pulmonary effort is normal.     Breath sounds: Normal breath sounds. No wheezing or rales.  Neurological:     Mental Status: She is alert.    UC Treatments / Results  Labs (all labs ordered are listed, but only abnormal results are displayed) Labs Reviewed - No data to display  EKG   Radiology No results found.  Procedures Procedures (including critical care time)  Medications Ordered in UC Medications - No data to display  Initial Impression / Assessment and Plan / UC Course  I have reviewed the triage vital signs and the nursing notes.  Pertinent labs & imaging results that were available during my care of the patient were reviewed by me and considered in my medical decision making (see chart for details).    40 year old female presents with sinusitis. Treating with Cefuroxime (patient states that she has difficulty tolerating medications and has been treated for sinusitis in the past with this medication and did well).  Final Clinical Impressions(s) / UC Diagnoses   Final diagnoses:  Acute maxillary sinusitis, recurrence not specified   Discharge Instructions   None    ED Prescriptions    Medication Sig Dispense Auth. Provider   cefUROXime (CEFTIN) 500 MG tablet Take 1 tablet (500 mg total) by mouth 2 (two) times daily with a meal. 20 tablet Coral Spikes, DO     PDMP not reviewed this encounter.   Coral Spikes, [place] 04/29/20 2145

## 2021-01-05 ENCOUNTER — Other Ambulatory Visit: Payer: Self-pay

## 2021-01-05 ENCOUNTER — Emergency Department: Payer: BC Managed Care – PPO

## 2021-01-05 ENCOUNTER — Emergency Department
Admission: EM | Admit: 2021-01-05 | Discharge: 2021-01-05 | Disposition: A | Discharge status: Home / Self Care | Payer: BC Managed Care – PPO | Attending: Emergency Medicine | Admitting: Emergency Medicine

## 2021-01-05 DIAGNOSIS — O209 Hemorrhage in early pregnancy, unspecified: Secondary | ICD-10-CM | POA: Insufficient documentation

## 2021-01-05 DIAGNOSIS — N939 Abnormal uterine and vaginal bleeding, unspecified: Secondary | ICD-10-CM

## 2021-01-05 DIAGNOSIS — O4690 Antepartum hemorrhage, unspecified, unspecified trimester: Secondary | ICD-10-CM

## 2021-01-05 DIAGNOSIS — Z3A11 11 weeks gestation of pregnancy: Secondary | ICD-10-CM | POA: Diagnosis not present

## 2021-01-05 DIAGNOSIS — N9489 Other specified conditions associated with female genital organs and menstrual cycle: Secondary | ICD-10-CM | POA: Insufficient documentation

## 2021-01-05 DIAGNOSIS — O200 Threatened abortion: Secondary | ICD-10-CM

## 2021-01-05 LAB — CBC WITH DIFFERENTIAL/PLATELET
Abs Immature Granulocytes: 0.06 10*3/uL (ref 0.00–0.07)
Basophils Absolute: 0 10*3/uL (ref 0.0–0.1)
Basophils Relative: 0 %
Eosinophils Absolute: 0.1 10*3/uL (ref 0.0–0.5)
Eosinophils Relative: 1 %
HCT: 36 % (ref 36.0–46.0)
Hemoglobin: 12.4 g/dL (ref 12.0–15.0)
Immature Granulocytes: 1 %
Lymphocytes Relative: 17 %
Lymphs Abs: 1.5 10*3/uL (ref 0.7–4.0)
MCH: 33.1 pg (ref 26.0–34.0)
MCHC: 34.4 g/dL (ref 30.0–36.0)
MCV: 96 fL (ref 80.0–100.0)
Monocytes Absolute: 0.6 10*3/uL (ref 0.1–1.0)
Monocytes Relative: 7 %
Neutro Abs: 6.6 10*3/uL (ref 1.7–7.7)
Neutrophils Relative %: 74 %
Platelets: 268 10*3/uL (ref 150–400)
RBC: 3.75 MIL/uL — ABNORMAL LOW (ref 3.87–5.11)
RDW: 12.8 % (ref 11.5–15.5)
WBC: 8.9 10*3/uL (ref 4.0–10.5)
nRBC: 0 % (ref 0.0–0.2)

## 2021-01-05 LAB — BASIC METABOLIC PANEL
Anion gap: 8 (ref 5–15)
BUN: 15 mg/dL (ref 6–20)
CO2: 24 mmol/L (ref 22–32)
Calcium: 8.6 mg/dL — ABNORMAL LOW (ref 8.9–10.3)
Chloride: 107 mmol/L (ref 98–111)
Creatinine, Ser: 0.55 mg/dL (ref 0.44–1.00)
GFR, Estimated: >60 mL/min (ref >60)
Glucose, Bld: 90 mg/dL (ref 70–99)
Potassium: 4.3 mmol/L (ref 3.5–5.1)
Sodium: 139 mmol/L (ref 135–145)

## 2021-01-05 LAB — HCG, QUANTITATIVE, PREGNANCY: hCG, Beta Chain, Quant, S: 78315 m[IU]/mL — ABNORMAL HIGH (ref <5)

## 2021-01-05 LAB — SAMPLE TO BLOOD BANK

## 2021-01-05 LAB — POC URINE PREG, ED: Preg Test, Ur: POSITIVE — AB

## 2021-01-05 NOTE — ED Triage Notes (Signed)
Pt states she is [redacted] weeks pregnant and is having abd cramping with passing clots, states has had a normal [place] with this pregnancy, is a pt of [facility]. Pt is in NAD at present.

## 2021-01-05 NOTE — ED Provider Notes (Signed)
[facility] Emergency Department Provider Note  ____________________________________________   Event Date/Time   First MD Initiated Contact with Patient 01/05/21 1056     (approximate)  I have reviewed the triage vital signs and the nursing notes.   HISTORY  Chief Complaint Vaginal Bleeding    HPI Candice Mccall is a 40 y.o. female with history of pregnancy who comes in at 11 weeks 3 days with concerns for heavy bleeding and heavy cramping to note patient had ultrasound on 10/10 but I am unable to see it.  Per patient she reports having a normal ultrasound at that time.  She states that she developed some vaginal bleeding today, clots, was minimal in nature and has now since stopped but she does continue to have a little bit of abdominal cramping, lower back pain.  Not focal to one side, nothing makes it better or worse.  Denies any other new symptoms.  Patient does report having a history of 2 prior pregnancies without any complications    Past Medical History:  Diagnosis Date   Anxiety    Colitis 2017   thought abdominal pain was colitis but it is not.   Eustachian tube disorder, right     Patient Active Problem List   Diagnosis Date Noted   Colitis 02/04/2015    Past Surgical History:  Procedure Laterality Date   CYSTOSCOPY N/A 06/02/2015   Procedure: CYSTOSCOPY;  Surgeon: Malachy Mood, MD;  Location: [facility] ORS;  Service: Gynecology;  Laterality: N/A;   DIAGNOSTIC LAPAROSCOPY  2014   looked like endometriosis   GIVENS CAPSULE STUDY N/A 02/07/2015   Procedure: GIVENS CAPSULE STUDY;  Surgeon: Manya Silvas, MD;  Location: [place];  Service: Endoscopy;  Laterality: N/A;   LAPAROSCOPY N/A 06/02/2015   Procedure: LAPAROSCOPY DIAGNOSTIC;  Surgeon: Malachy Mood, MD;  Location: [facility] OR[facility];  Service: Gynecology;  Laterality: N/A;    Prior to Admission medications   Medication Sig Start Date End Date Taking? Authorizing Provider   cefUROXime (CEFTIN) 500 MG tablet Take 1 tablet (500 mg total) by mouth 2 (two) times daily with a meal. 04/28/20   Cook, Michael [place] G, DO  DULoxetine (CYMBALTA) 30 MG capsule Take 30 mg by mouth every evening.     [provider]  traZODone (DESYREL) 50 MG tablet TAKE 1-2 TABS AT BEDTIME AS NEEDED 04/07/20   [provider]    Allergies Lactose intolerance (gi), Penicillins, and Sulfa antibiotics  Family History  Problem Relation Age of Onset   Diabetes Neg Hx     Social History Social History   Tobacco Use   Smoking status: Never   Smokeless tobacco: Never  Vaping Use   Vaping Use: Never used  Substance Use Topics   Alcohol use: No   Drug use: No      Review of Systems Constitutional: No fever/chills Eyes: No visual changes. ENT: No sore throat. Cardiovascular: Denies chest pain. Respiratory: Denies shortness of breath. Gastrointestinal: No abdominal pain.  No nausea, no vomiting.  No diarrhea.  No constipation. Genitourinary: Negative for dysuria.  Vaginal bleeding in pregnancy Musculoskeletal: Negative for back pain. Skin: Negative for rash. Neurological: Negative for headaches, focal weakness or numbness. All other ROS negative ____________________________________________   PHYSICAL EXAM:  VITAL SIGNS: ED Triage Vitals [01/05/21 1039]  Enc Vitals Group     BP 106/61     Pulse Rate 85     Resp 18     Temp 98.9 F (37.2 C)  Temp Source Oral     SpO2 100 %     Weight      Height      Head Circumference      Peak Flow      Pain Score      Pain Loc      Pain Edu?      Excl. in [place]?     Constitutional: Alert and oriented. Well appearing and in no acute distress. Eyes: Conjunctivae are normal. EOMI. Head: Atraumatic. Nose: No congestion/rhinnorhea. Mouth/Throat: Mucous membranes are moist.   Neck: No stridor. Trachea Midline. FROM Cardiovascular: Normal rate, regular rhythm. Grossly normal heart sounds.  Good peripheral  circulation. Respiratory: Normal respiratory effort.  No retractions. Lungs CTAB. Gastrointestinal: Soft and nontender. No distention. No abdominal bruits.  Musculoskeletal: No lower extremity tenderness nor edema.  No joint effusions. Neurologic:  Normal speech and language. No gross focal neurologic deficits are appreciated.  Skin:  Skin is warm, dry and intact. No rash noted. Psychiatric: Mood and affect are normal. Speech and behavior are normal. GU: Deferred   ____________________________________________   LABS (all labs ordered are listed, but only abnormal results are displayed)  Labs Reviewed  CBC WITH DIFFERENTIAL/PLATELET - Abnormal; Notable for the following components:      Result Value   RBC 3.75 (*)    All other components within normal limits  POC URINE PREG, ED - Abnormal; Notable for the following components:   Preg Test, Ur POSITIVE (*)    All other components within normal limits  HCG, QUANTITATIVE, PREGNANCY  BASIC METABOLIC PANEL   ____________________________________________  RADIOLOGY   Official radiology report(s): US OB Comp Less 14 Wks  Result Date: 01/05/2021 CLINICAL DATA:  Vaginal bleeding and cramping since this morning. Eleven weeks 2 days by outside ultrasound EXAM: OBSTETRIC <14 WK ULTRASOUND TECHNIQUE: Transabdominal ultrasound was performed for evaluation of the gestation as well as the maternal uterus and adnexal regions. COMPARISON:  None. FINDINGS: Intrauterine gestational sac: Present Yolk sac:  Present Embryo:  Present Cardiac Activity: Present Heart Rate: 178 bpm CRL:   57.3 mm   12 w 2 d                  [place] EDC: 07/18/2021 Subchorionic hemorrhage: Small to moderate volume subchorionic hemorrhage identified about the left superior sac including on image 40. Example 3.9 x 2.1 x 1.8 cm Maternal uterus/adnexae: Normal left ovary. Right ovarian hypoechoic lesion of 1.8 cm is most likely a corpus luteal cyst. No significant free fluid. IMPRESSION:  Intrauterine gestation of approximately 12 weeks 2 days with fetal heart rate of 178 beats per minute. Small to moderate volume subchorionic hemorrhage. Right ovarian hypoechoic lesion is likely a corpus luteal cyst. Electronically Signed   By: [name] M.D.   On: 01/05/2021 12:29    ____________________________________________   PROCEDURES  Procedure(s) performed (including Critical Care):  Procedures   ____________________________________________   INITIAL IMPRESSION / ASSESSMENT AND PLAN / ED COURSE  DORTHEY DEPACE was evaluated in Emergency Department on 01/05/2021 for the symptoms described in the history of present illness. She was evaluated in the context of the global COVID-19 pandemic, which necessitated consideration that the patient might be at risk for infection with the SARS-CoV-2 virus that causes COVID-19. Institutional protocols and algorithms that pertain to the evaluation of patients at risk for COVID-19 are in a state of rapid change based on information released by regulatory bodies including the CDC and federal and state organizations.  These policies and algorithms were followed during the patient's care in the ED.    Patient comes in with vaginal bleeding in pregnancy.  Ultrasound ordered to evaluate for miscarriage, threatened miscarriage.  Lower suspicion for ectopic given report of prior ultrasound being normal.  Patient reports being O+ does not need RhoGAM.  I offered to do blood testing today but she is adamant that she is O+ and has never needed RhoGAM in her prior pregnancies.  Hemoglobin checked to evaluate for anemia.  Offered pelvic exam patient's bleeding has now since stopped and she denies any concerns for STDs is declined.  Patient declined anything for pain medication.   Labs reviewed and are reassuring.  No evidence of anemia.  Ultrasound shows subchorionic hemorrhage.  Discussed this with patient and she will follow-up with her OB/GYN.       ____________________________________________   FINAL CLINICAL IMPRESSION(S) / ED DIAGNOSES   Final diagnoses:  Vaginal bleeding  Vaginal bleeding in pregnancy      MEDICATIONS GIVEN DURING THIS VISIT:  Medications - No data to display   ED Discharge Orders     None        Note:  This document was prepared using Dragon voice recognition software and may include unintentional dictation errors.    Vanessa Kiowa, MD 01/05/21 [phone]

## 2021-01-05 NOTE — Discharge Instructions (Addendum)
Return to ER for pain not controlled with Tylenol, bleeding greater than 1 pad an hour otherwise follow-up with your OB/GYN  IMPRESSION:  Intrauterine gestation of approximately 12 weeks 2 days with fetal  heart rate of 178 beats per minute.     Small to moderate volume subchorionic hemorrhage.     Right ovarian hypoechoic lesion is likely a corpus luteal cyst.

## 2021-01-05 NOTE — ED Notes (Addendum)
Pt reports vaginal bleeding, cramping , reports [redacted] weeks pregnant, 3rd pregnancy, provider at bedside

## 2021-01-09 ENCOUNTER — Ambulatory Visit: Payer: BC Managed Care – PPO

## 2021-01-11 ENCOUNTER — Ambulatory Visit: Payer: BC Managed Care – PPO | Attending: Maternal & Fetal Medicine | Admitting: Maternal & Fetal Medicine

## 2021-01-11 ENCOUNTER — Other Ambulatory Visit: Payer: Self-pay

## 2021-01-11 VITALS — BP 121/75 | HR 85 | Temp 98.6°F | Ht 64.0 in | Wt 130.0 lb

## 2021-01-11 DIAGNOSIS — G90A Postural orthostatic tachycardia syndrome (POTS): Secondary | ICD-10-CM

## 2021-01-11 DIAGNOSIS — Z3A12 12 weeks gestation of pregnancy: Secondary | ICD-10-CM

## 2021-01-11 DIAGNOSIS — O468X1 Other antepartum hemorrhage, first trimester: Secondary | ICD-10-CM | POA: Diagnosis not present

## 2021-01-11 DIAGNOSIS — D4702 Systemic mastocytosis: Secondary | ICD-10-CM

## 2021-01-11 DIAGNOSIS — K529 Noninfective gastroenteritis and colitis, unspecified: Secondary | ICD-10-CM

## 2021-01-11 DIAGNOSIS — O418X10 Other specified disorders of amniotic fluid and membranes, first trimester, not applicable or unspecified: Secondary | ICD-10-CM | POA: Diagnosis not present

## 2021-01-11 DIAGNOSIS — O09521 Supervision of elderly multigravida, first trimester: Secondary | ICD-10-CM

## 2021-01-11 NOTE — Progress Notes (Signed)
MATERNAL FETAL MEDICINE CONSULT  Patient Name: [name] Medical Record Number: [id number] Date of Birth: 12-29-1980  Requesting Physician Name: Greggory Brandy, CNM Date of Service: 01/11/21   Chief Condition Mast cell activation syndrome.  History of Present Illness  Candice Mccall is a 40 y.o. G3P2 at [redacted]w[redacted]d  with an EDD of Estimated Date of Delivery: 07/25/21 by LMP and first trimester ultrasound dating method.  Her pregnancy has been complicated by early bleeding with subchorionic hemorrhage and nausea, that has improved over the last two weeks.  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 She has performed skin prick testing that did not reveal significant reaction to foods that she has significant symptoms to. She does not have significant respiratory symptoms but does not some chest tightness and shortness of breath. However, her provider reports mild symptoms managed with cetrizine.  Her typical diet includes steak and avocado, she avoids, rice, grains, nuts, most fruits, dairy, and nightshade. She also has sensitivity to condoms and at times hot water.   Her prior pregnancy went to term however, she reported significant urticaria resultng in marks on her arms and hair loss. She did not have PUPPS or cholestasis of pregnancy. There was mention that she had FGR but that last exam that we see in the chart notes an EFW of 22% and normal AC.  She continues to limit her diet to manage her symptoms.   2) Subchorionic hemorrhage: Candice Mccall report earlier bleeding during the pregnancy. She notes some mild spotting but has since resolved. The [facility] was 3.9 x 2.1 x 1.8 cm.   3) Advanced maternal age:  Candice Mccall was counseled previously regarding the AFP and cell free DNA for genetic screening. This test has  not been drawn yet but she is planning to have this performed at her providers office.  4) POTS (postural orthostatic tachycardia syndrome). She was last seen in 2020 by her cardiologist she had a negative EKG and echocardiogram. She had borderline positive tilt test. She reports that her symptoms have been varied. She reports that she hasn't been able to exercise as much due to her symptoms. She is not on medications at this time and has managed her symptoms under the guidance of her primary care provider.   5) Depression with a PHQ-9 test of 2 (today). Candice Mccall notes that this pregnancy has been difficult so far with the nausea and bleeding. Her health is a challenge with limited eating and its highly reactive to various foods and stimulation. She at times feels that she may be better off not alive but denies suicidal ideation or a desire to hurt others.  She takes Cymbalta and sees a psychotherapist 1.5 times per month.   She also take Trazadone for insomnia  Vitals with BMI 01/11/2021 01/05/2021 01/05/2021  Height 5\' 4"  - -  Weight 130 lbs - -  BMI 76.2 - -  Systolic 831 517 616  Diastolic 75 64 61  Pulse 85 84 85   CBC Latest Ref Rng & Units 01/05/2021 02/08/2015 02/07/2015  WBC 4.0 - 10.5 K/uL 8.9 7.7 10.9  Hemoglobin 12.0 - 15.0 g/dL 12.4 12.5 12.4  Hematocrit 36.0 - 46.0 % 36.0 38.2 38.2  Platelets 150 - 400 K/uL 268 256 287   CMP Latest Ref Rng & Units 01/05/2021 02/08/2015 02/07/2015  Glucose 70 - 99 mg/dL 90 84 94  BUN 6 - 20 mg/dL 15 <5(L) <5(L)  Creatinine 0.44 - 1.00 mg/dL 0.55 0.84 0.82  Sodium 135 - 145 mmol/L 139 142 144  Potassium 3.5 - 5.1 mmol/L 4.3 3.7 3.5  Chloride 98 - 111 mmol/L 107 110 111  CO2 22 - 32 mmol/L 24 28 27   Calcium 8.9 - 10.3 mg/dL 8.6(L) 8.6(L) 8.2(L)  Total Protein 6.5 - 8.1 g/dL - - 5.3(L)  Total Bilirubin 0.3 - 1.2 mg/dL - - 0.4  Alkaline Phos 38 - 126 U/L - - 49  AST 15 - 41 U/L - - 15  ALT 14 - 54 U/L - - 10(L)       Review of  Systems   Pertinent items are noted in HPI.  Patient History   OB History  Gravida Para Term Preterm AB Living  3 2 2  0 0 2  SAB IAB Ectopic Multiple Live Births  0 0 0 0 2    # Outcome Date GA Lbr Len/2nd Weight Sex Delivery Anes PTL Lv  3 Current           2 Term 11/11/13   2631 g F Vag-Spont   LIV  1 Term 2012   3175 g F Vag-Spont   LIV      Past Medical History:  Diagnosis Date   Anxiety    Colitis 2017   thought abdominal pain was colitis but it is not.   Eustachian tube disorder, right     Past Surgical History:  Procedure Laterality Date   CYSTOSCOPY N/A 06/02/2015   Procedure: CYSTOSCOPY;  Surgeon: Malachy Mood, MD;  Location: [facility];  Service: Gynecology;  Laterality: N/A;   DIAGNOSTIC LAPAROSCOPY  2014   looked like endometriosis   GIVENS CAPSULE STUDY N/A 02/07/2015   Procedure: GIVENS CAPSULE STUDY;  Surgeon: Manya Silvas, MD;  Location: [place];  Service: Endoscopy;  Laterality: N/A;   LAPAROSCOPY N/A 06/02/2015   Procedure: LAPAROSCOPY DIAGNOSTIC;  Surgeon: Malachy Mood, MD;  Location: [facility];  Service: Gynecology;  Laterality: N/A;     Social History   Socioeconomic History   Marital status: Married    Spouse name: Candice Mccall   Number of children: Not on file   Years of education: Not on file   Highest education level: Not on file  Occupational History   Occupation: Teacher  Tobacco Use   Smoking status: Never   Smokeless tobacco: Never  Vaping Use   Vaping Use: Never used  Substance and Sexual Activity   Alcohol use: Never   Drug use: No   Sexual activity: Yes    Birth control/protection: None  Other Topics Concern   Not on file  Social History Narrative   Not on file   Social Determinants of Health   Financial Resource Strain: Not on file  Food Insecurity: Not on file  Transportation Needs: Not on file  Physical Activity: Not on file  Stress: Not on file  Social Connections: Not on file  Intimate Partner  Violence: Not on file      Ultrasound results: Not performed today.  Impression/Counseling:  1) Mast cell activation syndrome  I discussed with Ms. Blizzard that there is limited information regarding Mast cell activation in pregnancy. Systemic mastocytosis may be a more closely matched diagnosis. The case reviews and reports reviewed discuss a variable presentation throughout pregnancy with some patients becoming worse and others improving. There is also a relationship to  symptomology prior to pregnancy and that related to pregnancy experience meaning that if symptoms were controlled prior to then they are likely to be controlled during the pregnancy. Secondly there is also a trimester related trend meaning possible worsening or improvement during trimesters and postpartum.   The conclusion of most studies focus on the use of H1 blockers and symptom management.   Ms. Easton has a categorization of symptoms but not a true clear diagnosis. As some notes suggest possible IgE mediated disease vs alpha gal meat allergy.  Lastly, she is most concern regarding the urticaria she experienced in her prior pregnancy. I could not provide reassurance that this would not recur given that there has not been more definitive diagnosis or successful management of her symptoms since that time.  Of note there does not appear to be a direct correlation of an increased risk for preterm birth, preeclampsia or fetal growth restriction in women carrying the diagnosis of mast cell activation.  Nevertheless it is reasonable to perform a third trimester growth at 28 week, and consider a 32, and 36 weeks assessment, given her age of 23 and low caloric intake (discussed below further).  2) Advance maternal age: Consider genetic screening via cell free DNA, AFP between 15-18 weeks, and detailed exam at 18-20 weeks.  We discussed an increased risk for gestational diabetes, preeclampsia and fetal growth delays.  Ms. Eugenio Hoes  appears to have a low calorie intake with a deficiency in carbohydrates and some fats. I discussed that her total calorie intake should be about 2200 to 2900 kcal/day in the second and third trimesters. She will plan on consider how to obtain these calories given her limited food tolerance.   3) Subchorionic hemorrhage  I discussed an increased association with preterm premature rupture of membranes and placental abruption. I provided reassurance as her bleeding has improved.   4) POTS syndrome Overall symptoms have improved. We discussed that in general over 2/3 women report an improvement in symptoms. Studies suggest continuing current therapy and exercise programs in the prepregnancy as in pregnancy. Ms. Yaklin is not increasing salt in take at this time and does not have a specific exercise program. However, she plans to meet the her cardiologist if her symptoms return.  5)  Depression:  I discussed the common nature of severe depression in pregnancy. We discussed the association with fetal growth delays, preterm birth and low weight gain in women with moderate to severe depressive symptoms and pregnant. I also discussed the increased risk for worsening postpartum depression.  She is currently receiving psychotherapy as needed and pharmacotherapy. I recommend regular psychotherapy weekly if affordable as it can be an effective adjunct.   In addition, we recommend not discontinuing therapy as worsening disease and relapse is common and can be more severe.  Screening regarding suicide ideation should be performed at each prenatal visit.   We discussed the safety of Trazadone and Cymbalta.  Cymbalta is not expected to increased congenital anomalies however, there are some cases reporting neonatal withdrawal syndromes with weight loss, tremors, irritability and hyperbilirubinemia.   Trazadone is not associated with increased congenital anomalies there is limited data regarding the human  experience.  We discussed given her symptoms of depression and insomnia, the principle of lowest effective dose and monotherapy is recommended. However, given her symptoms she may need to increase her dose of Cymbalta   I spent 60 minutes with > 50% in face to face consultation.   Jaynie Collins, MD

## 2021-01-18 LAB — OB RESULTS CONSOLE GC/CHLAMYDIA
Chlamydia: NEGATIVE
Gonorrhea: NEGATIVE

## 2021-01-18 LAB — OB RESULTS CONSOLE RUBELLA ANTIBODY, IGM: Rubella: IMMUNE

## 2021-01-18 LAB — OB RESULTS CONSOLE VARICELLA ZOSTER ANTIBODY, IGG: Varicella: NON-IMMUNE/NOT IMMUNE

## 2021-01-18 LAB — OB RESULTS CONSOLE HEPATITIS B SURFACE ANTIGEN: Hepatitis B Surface Ag: NEGATIVE

## 2021-03-18 NOTE — L&D Delivery Note (Signed)
Delivery Note ? ?First Stage: ?Labor onset: 1330 ?Augmentation: AROM ?Analgesia /Anesthesia intrapartum: epidural ?AROM at 2310, scant clear fluid ? ?Second Stage: ?Complete dilation at 0032 ?Onset of pushing at 0041 ?FHR second stage Cat II- early/variable decels ? ?Delivery of a viable female infant on 07/14/21 at [facility] by CNM ?delivery of fetal head in OA position with restitution to LOA. ?No nuchal cord; Anterior then posterior shoulders delivered easily with gentle downward traction. Immediately following delivery of infant, large amount bloody fluid with clots expelled.  ?Baby placed on mom's chest, and attended to by peds.  ?Cord double clamped after cessation of pulsation, cut by FOB ?Cord blood sample collected  ? ? ?Third Stage: ?Placenta delivered spontaneously intact with 3VC @ 0053 ?Placenta disposition: routine  ?Uterine tone firm / bleeding scant ? ?1st deg midline perineal laceration identified; no cervical or vaginal lacs.  ?Anesthesia for repair: epidural ?Repair 2-0 Vicryl CT-1 x 2 interrupted stitches to achieve hemostasis.  ?Est. Blood Loss (mL): 300 ? ?Complications: none ? ?Mom to postpartum.  Baby to Couplet care / Skin to Skin. ? ?Newborn: ?Birth Weight: pending  ?Apgar Scores: 9/9 ?Feeding planned: formula ? ? ? ?

## 2021-06-29 LAB — OB RESULTS CONSOLE HIV ANTIBODY (ROUTINE TESTING): HIV: NONREACTIVE

## 2021-06-29 LAB — OB RESULTS CONSOLE GBS: GBS: NEGATIVE

## 2021-06-29 LAB — OB RESULTS CONSOLE RPR: RPR: NONREACTIVE

## 2021-07-13 ENCOUNTER — Inpatient Hospital Stay: Payer: BC Managed Care – PPO | Admitting: Anesthesiology

## 2021-07-13 ENCOUNTER — Encounter: Payer: Self-pay | Admitting: Obstetrics and Gynecology

## 2021-07-13 ENCOUNTER — Inpatient Hospital Stay
Admission: EM | Admit: 2021-07-13 | Discharge: 2021-07-15 | DRG: 806 | Disposition: A | Discharge status: Home / Self Care | Payer: BC Managed Care – PPO | Attending: Obstetrics and Gynecology | Admitting: Obstetrics and Gynecology

## 2021-07-13 ENCOUNTER — Other Ambulatory Visit: Payer: Self-pay

## 2021-07-13 DIAGNOSIS — Z79899 Other long term (current) drug therapy: Secondary | ICD-10-CM

## 2021-07-13 DIAGNOSIS — O9081 Anemia of the puerperium: Secondary | ICD-10-CM | POA: Diagnosis not present

## 2021-07-13 DIAGNOSIS — O99354 Diseases of the nervous system complicating childbirth: Secondary | ICD-10-CM | POA: Diagnosis present

## 2021-07-13 DIAGNOSIS — Z3A38 38 weeks gestation of pregnancy: Secondary | ICD-10-CM | POA: Diagnosis not present

## 2021-07-13 DIAGNOSIS — Z880 Allergy status to penicillin: Secondary | ICD-10-CM

## 2021-07-13 DIAGNOSIS — F32A Depression, unspecified: Secondary | ICD-10-CM | POA: Diagnosis present

## 2021-07-13 DIAGNOSIS — O99344 Other mental disorders complicating childbirth: Secondary | ICD-10-CM | POA: Diagnosis present

## 2021-07-13 DIAGNOSIS — O26893 Other specified pregnancy related conditions, third trimester: Secondary | ICD-10-CM | POA: Diagnosis present

## 2021-07-13 DIAGNOSIS — G90A Postural orthostatic tachycardia syndrome (POTS): Secondary | ICD-10-CM | POA: Diagnosis present

## 2021-07-13 LAB — CBC
HCT: 31.6 % — ABNORMAL LOW (ref 36.0–46.0)
Hemoglobin: 9.9 g/dL — ABNORMAL LOW (ref 12.0–15.0)
MCH: 28.7 pg (ref 26.0–34.0)
MCHC: 31.3 g/dL (ref 30.0–36.0)
MCV: 91.6 fL (ref 80.0–100.0)
Platelets: 223 10*3/uL (ref 150–400)
RBC: 3.45 MIL/uL — ABNORMAL LOW (ref 3.87–5.11)
RDW: 13.5 % (ref 11.5–15.5)
WBC: 10.6 10*3/uL — ABNORMAL HIGH (ref 4.0–10.5)
nRBC: 0 % (ref 0.0–0.2)

## 2021-07-13 LAB — TYPE AND SCREEN
ABO/RH(D): O POS
Antibody Screen: NEGATIVE

## 2021-07-13 MED ORDER — DIPHENHYDRAMINE HCL 50 MG/ML IJ SOLN: 12.5000 mg | INTRAMUSCULAR | Status: DC | PRN

## 2021-07-13 MED ORDER — LACTATED RINGERS IV SOLN: INTRAVENOUS | Status: DC

## 2021-07-13 MED ORDER — LACTATED RINGERS IV SOLN: 500.0000 mL | INTRAVENOUS | Status: DC | PRN

## 2021-07-13 MED ORDER — EPHEDRINE 5 MG/ML INJ
10.0000 mg | INTRAVENOUS | Status: DC | PRN
  Filled 2021-07-13: qty 2

## 2021-07-13 MED ORDER — FENTANYL-BUPIVACAINE-NACL 0.5-0.125-0.9 MG/250ML-% EP SOLN
EPIDURAL | Status: AC
  Filled 2021-07-13: qty 250

## 2021-07-13 MED ORDER — OXYTOCIN 10 UNIT/ML IJ SOLN
INTRAMUSCULAR | Status: AC
  Filled 2021-07-13: qty 2

## 2021-07-13 MED ORDER — LACTATED RINGERS IV SOLN
500.0000 mL | Freq: Once | INTRAVENOUS | Status: AC
  Administered 2021-07-13: 500 mL via INTRAVENOUS

## 2021-07-13 MED ORDER — LIDOCAINE-EPINEPHRINE (PF) 1.5 %-1:200000 IJ SOLN
INTRAMUSCULAR | Status: DC | PRN
  Administered 2021-07-13: 3 mL via EPIDURAL

## 2021-07-13 MED ORDER — FENTANYL-BUPIVACAINE-NACL 0.5-0.125-0.9 MG/250ML-% EP SOLN
12.0000 mL/h | EPIDURAL | Status: DC | PRN
  Administered 2021-07-13: 12 mL/h via EPIDURAL

## 2021-07-13 MED ORDER — ACETAMINOPHEN 325 MG PO TABS
650.0000 mg | ORAL_TABLET | ORAL | Status: DC | PRN
Start: 2021-07-13 — End: 2021-07-14

## 2021-07-13 MED ORDER — ONDANSETRON HCL 4 MG/2ML IJ SOLN
4.0000 mg | Freq: Four times a day (QID) | INTRAMUSCULAR | Status: DC | PRN
  Administered 2021-07-13: 4 mg via INTRAVENOUS
  Filled 2021-07-13: qty 2

## 2021-07-13 MED ORDER — SODIUM CHLORIDE 0.9 % IV SOLN
INTRAVENOUS | Status: DC | PRN
  Administered 2021-07-13: 8 mL via EPIDURAL

## 2021-07-13 MED ORDER — OXYTOCIN-SODIUM CHLORIDE 30-0.9 UT/500ML-% IV SOLN
2.5000 [IU]/h | INTRAVENOUS | Status: DC
  Administered 2021-07-14: 2.5 [IU]/h via INTRAVENOUS
  Filled 2021-07-13: qty 500

## 2021-07-13 MED ORDER — PHENYLEPHRINE 80 MCG/ML (10ML) SYRINGE FOR IV PUSH (FOR BLOOD PRESSURE SUPPORT)
80.0000 ug | PREFILLED_SYRINGE | INTRAVENOUS | Status: DC | PRN
  Filled 2021-07-13: qty 10

## 2021-07-13 MED ORDER — LIDOCAINE HCL (PF) 1 % IJ SOLN
30.0000 mL | INTRAMUSCULAR | Status: DC | PRN
  Filled 2021-07-13: qty 30

## 2021-07-13 MED ORDER — OXYTOCIN BOLUS FROM INFUSION
333.0000 mL | Freq: Once | INTRAVENOUS | Status: AC
  Administered 2021-07-14: 333 mL via INTRAVENOUS

## 2021-07-13 MED ORDER — SOD CITRATE-CITRIC ACID 500-334 MG/5ML PO SOLN: 30.0000 mL | ORAL | Status: DC | PRN

## 2021-07-13 MED ORDER — MISOPROSTOL 200 MCG PO TABS
ORAL_TABLET | ORAL | Status: AC
  Filled 2021-07-13: qty 4

## 2021-07-13 MED ORDER — AMMONIA AROMATIC IN INHA
RESPIRATORY_TRACT | Status: DC
Start: 2021-07-13 — End: 2021-07-14
  Filled 2021-07-13: qty 10

## 2021-07-13 MED ORDER — FENTANYL CITRATE (PF) 100 MCG/2ML IJ SOLN: 50.0000 ug | INTRAMUSCULAR | Status: DC | PRN

## 2021-07-13 MED ORDER — LIDOCAINE HCL (PF) 1 % IJ SOLN
INTRAMUSCULAR | Status: DC | PRN
  Administered 2021-07-13: 3 mL via SUBCUTANEOUS

## 2021-07-13 NOTE — OB Triage Note (Signed)
Pt arrives with ctx's x 2-3 minutes. Alfonso Patten McVey, CNM at bedside to assess.  ?

## 2021-07-13 NOTE — Anesthesia Preprocedure Evaluation (Signed)
Anesthesia Evaluation  ?Patient identified by MRN, date of birth, ID band ?Patient awake ? ? ? ?Reviewed: ?Allergy & Precautions, NPO status , Patient's Chart, lab work & pertinent test results ? ?History of Anesthesia Complications ?Negative for: history of anesthetic complications ? ?Airway ?Mallampati: II ? ?TM Distance: >3 FB ?Neck ROM: Full ? ? ? Dental ?no notable dental hx. ?(+) Teeth Intact ?  ?Pulmonary ?neg pulmonary ROS, neg sleep apnea, neg COPD, Patient abstained from smoking.Not current smoker,  ?  ?Pulmonary exam normal ?breath sounds clear to auscultation ? ? ? ? ? ? Cardiovascular ?Exercise Tolerance: Good ?METS(-) hypertension(-) CAD and (-) Past MI negative cardio ROS ? ?(-) dysrhythmias  ?Rhythm:Regular Rate:Normal ?- Systolic murmurs ? ?  ?Neuro/Psych ?PSYCHIATRIC DISORDERS Anxiety negative neurological ROS ?   ? GI/Hepatic ?negative GI ROS, Neg liver ROS, neg GERD  ,  ?Endo/Other  ?negative endocrine ROSneg diabetes ? Renal/GU ?negative Renal ROS  ? ?  ?Musculoskeletal ?negative musculoskeletal ROS ?(+)  ? Abdominal ?Normal abdominal exam  (+)   ?Peds ?negative pediatric ROS ?(+)  Hematology ?negative hematology ROS ?(+)   ?Anesthesia Other Findings ?Past Medical History: ?No date: Anxiety ?2017: Colitis ?    Comment:  thought abdominal pain was colitis but it is not. ?No date: Eustachian tube disorder, right ? Reproductive/Obstetrics ?(+) Pregnancy ? ?  ? ? ? ? ? ? ? ? ? ? ? ? ? ?  ?  ? ? ? ? ? ? ? ? ?Anesthesia Physical ? ?Anesthesia Plan ? ?ASA: 2 ? ?Anesthesia Plan: Epidural  ? ?Post-op Pain Management:   ? ?Induction:  ? ?PONV Risk Score and Plan: 2 and Treatment may vary due to age or medical condition and Ondansetron ? ?Airway Management Planned: Natural Airway ? ?Additional Equipment:  ? ?Intra-op Plan:  ? ?Post-operative Plan:  ? ?Informed Consent: I have reviewed the patients History and Physical, chart, labs and discussed the procedure including the  risks, benefits and alternatives for the proposed anesthesia with the patient or authorized representative who has indicated his/her understanding and acceptance.  ? ? ? ? ? ?Plan Discussed with: Surgeon ? ?Anesthesia Plan Comments: (Discussed R/B/A of neuraxial anesthesia technique with patient: ?- rare risks of spinal/epidural hematoma, nerve damage, infection ?- Risk of PDPH ?- Risk of itching ?- Risk of nausea and vomiting ?- Risk of poor block necessitating replacement of epidural. ?- Risk of allergic reactions. ?Patient voiced understanding.)  ? ? ? ? ? ? ?Anesthesia Quick Evaluation ? ?

## 2021-07-13 NOTE — H&P (Signed)
OB History & Physical  ? ?History of Present Illness:  ?Chief Complaint: painful UCs since this afternoon ? ?HPI:  ?Candice Mccall is a 41 y.o. G3P2002 female at 9w2ddated by LMP 10/18/20, and c/w [place]at 144w4d She presents to L&D for onset of ctx this afternoon, currently every 2-3 minutes, denies LOF or VB, reports active FM.  ? ?Pregnancy Issues: ?1. Advanced maternal age, [age]yo2. Mast cell activation syndrome; significant food allergies and sensitvities; IgE mediated reactions.  ?3. [place] ?4. POTS ?5. Depression, on Cymbalta daily.  ?6. Varicella NON-immune ? ? ?Maternal Medical History:  ? ?Past Medical History:  ?Diagnosis Date  ? Anxiety   ? Colitis 2017  ? thought abdominal pain was colitis but it is not.  ? Eustachian tube disorder, right   ? ? ?Past Surgical History:  ?Procedure Laterality Date  ? CYSTOSCOPY N/A 06/02/2015  ? Procedure: CYSTOSCOPY;  Surgeon: AnMalachy MoodMD;  Location: [facility] ORS;  Service: Gynecology;  Laterality: N/A;  ? DIAGNOSTIC LAPAROSCOPY  2014  ? looked like endometriosis  ? GIVENS CAPSULE STUDY N/A 02/07/2015  ? Procedure: GIVENS CAPSULE STUDY;  Surgeon: RoManya SilvasMD;  [facility];  Service: Endoscopy;  Laterality: N/A;  ? LAPAROSCOPY N/A 06/02/2015  ? Procedure: LAPAROSCOPY DIAGNOSTIC;  Surgeon: AnMalachy MoodMD;  Location: [facility] ORS;  Service: Gynecology;  Laterality: N/A;  ? ? ?Allergies  ?Allergen Reactions  ? Lactose Intolerance (Gi) Other (See Comments)  ?  Unable to tolerate lactose products  ? Penicillins Hives and Other (See Comments)  ?  Has patient had a PCN reaction causing immediate rash, facial/tongue/throat swelling, SOB or lightheadedness with hypotension: No ?Has patient had a PCN reaction causing severe rash involving mucus membranes or skin necrosis: No ?Has patient had a PCN reaction that required hospitalization No ?Has patient had a PCN reaction occurring within the last 10 years: No ?If all of the above answers  are "NO", then may proceed with Cephalosporin use. ?  ? Sulfa Antibiotics Rash and Other (See Comments)  ?  Joint pain  ? ? ?Prior to Admission medications   ?Medication Sig Start Date End Date Taking? Authorizing Provider  ?DULoxetine (CYMBALTA) 30 MG capsule Take 30 mg by mouth every evening.    Yes [provider]  ?traZODone (DESYREL) 50 MG tablet TAKE 1-2 TABS AT BEDTIME AS NEEDED 04/07/20  Yes [provider]  ?cefUROXime (CEFTIN) 500 MG tablet Take 1 tablet (500 mg total) by mouth 2 (two) times daily with a meal. ?Patient not taking: Reported on 01/11/2021 04/28/20   CoCoral SpikesDO  ? ? ? ?[place] [facility] [place] ?Social History: She  reports that she has never smoked. She has never used smokeless tobacco. She reports that she does not drink alcohol and does not use drugs. ? ?Family History: family history includes Alzheimer's disease in her maternal grandmother; Cancer in her maternal grandfather and paternal grandmother; Diabetes in her brother and brother; Heart attack in her paternal grandfather; Rheum arthritis in her mother.  ? ?Review of Systems: A full review of systems was performed and negative except as noted in the HPI.   ? ? ?Physical Exam:  ?Vital Signs: BP 120/82 (BP Location: Left Arm)   Pulse 74   Temp 98.6 ?F (37 ?C) (Oral)   Resp 19   Ht '5\' 4"'$  (1.626 m)   Wt 69.4 kg   LMP 10/18/2020   BMI 26.26 kg/m?  ?  General: no acute distress.  ?HEENT: normocephalic, atraumatic ?Heart: regular rate & rhythm.  No murmurs/rubs/gallops ?Lungs: clear to auscultation bilaterally, normal respiratory effort ?Abdomen: soft, gravid, non-tender;  EFW: 7lbs  ?Pelvic:  ? External: Normal external female genitalia ? Cervix: Dilation: 3 /60/-2, BBOW, small amount bloody show.  ?  ?Extremities: non-tender, symmetric, no edema bilaterally.  DTRs: 2+  ?Neurologic: Alert & oriented x 3.   ? ?No results found for this or any previous visit (from the past 24  hour(s)). ? ?Pertinent Results:  ?Prenatal Labs: ?Blood type/Rh O  Pos  ?Antibody screen neg  ?Rubella Immune  ?Varicella NON-Immune  ?RPR NR  ?HBsAg Neg  ?HIV NR  ?GC neg  ?Chlamydia neg  ?Genetic screening negative  ?1 hour GTT Random glucose 80  ?3 hour GTT   ?GBS  Neg  ? ?FHT: 125bpm, mod variability, + accels, no decels ?TOCO: q2-11mn, palp mod ?SVE:  3/60/-2 BBOW bloody show ?  ?Cephalic by leopolds/SVE ? ?No results found. ? ?Assessment:  ?Candice BATTLEis a 41y.o. GG59P2002female at 333w2dith early  labor.  ? ?Plan:  ?1. Admit to Labor & Delivery; consents reviewed and obtained ?- d/w BEB, labor admit in early labor with regular UCs.  ? ?2. Fetal Well being  ?- Fetal Tracing: Cat I ?- Group B Streptococcus ppx indicated: Neg ?- Presentation: cephalic confirmed by exam  ? ?3. Routine OB: ?- Prenatal labs reviewed, as above ?- Rh O pos ?- CBC, T&S, RPR on admit ?- Clear fluids, IVF ? ?4. Monitoring of Labor ?-  Contractions: external toco in place ?-  Pelvis proven to 7lbs ?-  augment with AROM prn ?-  Plan for continuous fetal monitoring  ?-  Maternal pain control as desired ?- Anticipate vaginal delivery ? ?5. Post Partum Planning: ?- Infant feeding: breast ?- Contraception: partner vasectomy ?- Tdap: declined ? ?Candice Mccall ?07/13/21 ?10:05 PM ? ? ? ? ?

## 2021-07-13 NOTE — Anesthesia Procedure Notes (Signed)
Epidural ?Patient location during procedure: OB ? ?Staffing ?Anesthesiologist: Arita Miss, MD ?Performed: anesthesiologist  ? ?Preanesthetic Checklist ?Completed: patient identified, IV checked, site marked, risks and benefits discussed, surgical consent, monitors and equipment checked, pre-op evaluation and timeout performed ? ?Epidural ?Patient position: sitting ?Prep: ChloraPrep ?Patient monitoring: heart rate, continuous pulse ox and blood pressure ?Approach: midline ?Location: L3-L4 ?Injection technique: LOR saline ? ?Needle:  ?Needle type: Tuohy  ?Needle gauge: 17 G ?Needle length: 9 cm ?Needle insertion depth: 4 cm ?Catheter type: closed end flexible ?Catheter size: 19 Gauge ?Catheter at skin depth: 9 cm ?Test dose: negative and 1.5% lidocaine with Epi 1:200 K ? ?Assessment ?Sensory level: T10 ?Events: blood not aspirated, injection not painful, no injection resistance, no paresthesia and negative IV test ? ?Additional Notes ?first attempt ?Pt. Evaluated and documentation done after procedure finished. ?Patient identified. Risks/Benefits/Options discussed with patient including but not limited to bleeding, infection, nerve damage, paralysis, failed block, incomplete pain control, headache, blood pressure changes, nausea, vomiting, reactions to medication both or allergic, itching and postpartum back pain. Confirmed with bedside nurse the patient's most recent platelet count. Confirmed with patient that they are not currently taking any anticoagulation, have any bleeding history or any family history of bleeding disorders. Patient expressed understanding and wished to proceed. All questions were answered. Sterile technique was used throughout the entire procedure. Please see nursing notes for vital signs. Test dose was given through epidural catheter and negative prior to continuing to dose epidural or start infusion. Warning signs of high block given to the patient including shortness of breath,  tingling/numbness in hands, complete motor block, or any concerning symptoms with instructions to call for help. Patient was given instructions on fall risk and not to get out of bed. All questions and concerns addressed with instructions to call with any issues or inadequate analgesia.   ?  ?Patient tolerated the insertion well without immediate complications.  ?Reason for block: procedure for painReason for block:procedure for pain ? ? ? ?

## 2021-07-13 NOTE — Progress Notes (Signed)
Labor Progress Note ? ?[name] is a 41 y.o. G3P2002 at 71w2dby LMP admitted for active labor ? ?Subjective: comfortable after epidural ? ?Objective: ?BP 120/82 (BP Location: Left Arm)   Pulse 74   Temp 98.6 ?F (37 ?C) (Oral)   Resp 19   Ht '5\' 4"'$  (1.626 m)   Wt 69.4 kg   LMP 10/18/2020   BMI 26.26 kg/m?  ?Notable VS details: reviewed ? ?Fetal Assessment: ?FHT:  FHR: 120 bpm, variability: moderate,  accelerations:  Present,  decelerations:  Absent ?Category/reactivity:  Category I ?UC:   regular, every 2-4 minutes ?SVE:   4/75/-1, soft/posterior ?- BBOW noted, bloody show, AROM performed for small amount clear fluid.  ? ? ?Labs: ?Lab Results  ?Component Value Date  ? WBC 10.6 (H) 07/13/2021  ? HGB 9.9 (L) 07/13/2021  ? HCT 31.6 (L) 07/13/2021  ? MCV 91.6 07/13/2021  ? PLT 223 07/13/2021  ? ? ?Assessment / Plan: ?Spontaneous labor, progressing normally ? ?Labor: Progressing normally, AROM to augment.  ?Preeclampsia:  no signs or symptoms of toxicity ?Fetal Wellbeing:  Category I ?Pain Control:  Epidural ?I/D:   GBS neg ?Anticipated MOD:  NSVD ? ?RFrancetta Found CNM ?07/13/2021, 11:23 PM ? ? ? ? ? ? ? ? ?

## 2021-07-14 ENCOUNTER — Encounter: Payer: Self-pay | Admitting: Obstetrics and Gynecology

## 2021-07-14 LAB — ABO/RH: ABO/RH(D): O POS

## 2021-07-14 LAB — CBC
HCT: 28.7 % — ABNORMAL LOW (ref 36.0–46.0)
Hemoglobin: 9 g/dL — ABNORMAL LOW (ref 12.0–15.0)
MCH: 29.1 pg (ref 26.0–34.0)
MCHC: 31.4 g/dL (ref 30.0–36.0)
MCV: 92.9 fL (ref 80.0–100.0)
Platelets: 176 10*3/uL (ref 150–400)
RBC: 3.09 MIL/uL — ABNORMAL LOW (ref 3.87–5.11)
RDW: 13.6 % (ref 11.5–15.5)
WBC: 14.9 10*3/uL — ABNORMAL HIGH (ref 4.0–10.5)
nRBC: 0 % (ref 0.0–0.2)

## 2021-07-14 LAB — RPR: RPR Ser Ql: NONREACTIVE

## 2021-07-14 MED ORDER — IBUPROFEN 600 MG PO TABS
ORAL_TABLET | ORAL | Status: AC
  Administered 2021-07-14: 600 mg
  Filled 2021-07-14: qty 1

## 2021-07-14 MED ORDER — SIMETHICONE 80 MG PO CHEW: 80.0000 mg | CHEWABLE_TABLET | ORAL | Status: DC | PRN

## 2021-07-14 MED ORDER — ONDANSETRON HCL 4 MG PO TABS: 4.0000 mg | ORAL_TABLET | ORAL | Status: DC | PRN

## 2021-07-14 MED ORDER — VARICELLA VIRUS VACCINE LIVE 1350 PFU/0.5ML IJ SUSR
0.5000 mL | INTRAMUSCULAR | Status: DC | PRN
  Filled 2021-07-14: qty 0.5

## 2021-07-14 MED ORDER — SENNOSIDES-DOCUSATE SODIUM 8.6-50 MG PO TABS
2.0000 | ORAL_TABLET | Freq: Every day | ORAL | Status: DC
  Filled 2021-07-14: qty 2

## 2021-07-14 MED ORDER — PRENATAL MULTIVITAMIN CH
1.0000 | ORAL_TABLET | Freq: Every day | ORAL | Status: DC
  Filled 2021-07-14: qty 1

## 2021-07-14 MED ORDER — DIPHENHYDRAMINE HCL 25 MG PO CAPS: 25.0000 mg | ORAL_CAPSULE | Freq: Four times a day (QID) | ORAL | Status: DC | PRN

## 2021-07-14 MED ORDER — WITCH HAZEL-GLYCERIN EX PADS
MEDICATED_PAD | CUTANEOUS | Status: AC
  Filled 2021-07-14: qty 100

## 2021-07-14 MED ORDER — COCONUT OIL OIL: 1.0000 "application " | TOPICAL_OIL | Status: DC | PRN

## 2021-07-14 MED ORDER — BENZOCAINE-MENTHOL 20-0.5 % EX AERO: 1.0000 "application " | INHALATION_SPRAY | CUTANEOUS | Status: DC | PRN

## 2021-07-14 MED ORDER — ACETAMINOPHEN 325 MG PO TABS
650.0000 mg | ORAL_TABLET | ORAL | Status: DC | PRN
  Filled 2021-07-14: qty 2

## 2021-07-14 MED ORDER — ONDANSETRON HCL 4 MG/2ML IJ SOLN: 4.0000 mg | INTRAMUSCULAR | Status: DC | PRN

## 2021-07-14 MED ORDER — IBUPROFEN 600 MG PO TABS
600.0000 mg | ORAL_TABLET | Freq: Four times a day (QID) | ORAL | Status: DC
Start: 2021-07-14 — End: 2021-07-15
  Administered 2021-07-14 – 2021-07-15 (×5): 600 mg via ORAL
  Filled 2021-07-14 (×5): qty 1

## 2021-07-14 MED ORDER — WITCH HAZEL-GLYCERIN EX PADS
1.0000 | MEDICATED_PAD | CUTANEOUS | Status: DC | PRN
Start: 2021-07-14 — End: 2021-07-15

## 2021-07-14 MED ORDER — DIBUCAINE (PERIANAL) 1 % EX OINT: 1.0000 "application " | TOPICAL_OINTMENT | CUTANEOUS | Status: DC | PRN

## 2021-07-14 MED ORDER — TRAZODONE HCL 100 MG PO TABS
50.0000 mg | ORAL_TABLET | Freq: Every evening | ORAL | Status: DC | PRN
  Administered 2021-07-14: 50 mg via ORAL
  Filled 2021-07-14 (×2): qty 1

## 2021-07-14 MED ORDER — FERROUS SULFATE 325 (65 FE) MG PO TABS
325.0000 mg | ORAL_TABLET | Freq: Two times a day (BID) | ORAL | Status: DC
  Filled 2021-07-14 (×2): qty 1

## 2021-07-14 MED ORDER — BENZOCAINE-MENTHOL 20-0.5 % EX AERO
INHALATION_SPRAY | CUTANEOUS | Status: AC
  Filled 2021-07-14: qty 56

## 2021-07-14 MED ORDER — DULOXETINE HCL 30 MG PO CPEP
30.0000 mg | ORAL_CAPSULE | Freq: Every evening | ORAL | Status: DC
  Administered 2021-07-14: 30 mg via ORAL
  Filled 2021-07-14 (×2): qty 1

## 2021-07-14 NOTE — Progress Notes (Signed)
Post Partum Day 0 ?Subjective: ?Doing well, no complaints.  Tolerating regular diet, pain with PO meds, voiding and ambulating without difficulty. ? ?No CP SOB Fever,Chills, N/V or leg pain; denies nipple or breast pain, no HA change of vision, RUQ/epigastric pain ? ?Objective: ?BP 105/67 (BP Location: Left Arm)   Pulse 71   Temp 97.9 ?F (36.6 ?C)   Resp 18   Ht '5\' 4"'$  (1.626 m)   Wt 69.4 kg   LMP 10/18/2020   SpO2 98%   Breastfeeding Unknown   BMI 26.26 kg/m?  ?  ?Physical Exam:  ?General: NAD ?Breasts: soft/nontender ?CV: RRR ?Pulm: nl effort, CTABL ?Abdomen: soft, NT, BS x 4 ?Perineum: minimal edema, repair well approximated ?Lochia: moderate ?Uterine Fundus: fundus firm and 1 fb below umbilicus ?DVT Evaluation: no cords, ttp LEs  ? ?Recent Labs  ?  07/13/21 ?2200 07/14/21 ?0609  ?HGB 9.9* 9.0*  ?HCT 31.6* 28.7*  ?WBC 10.6* 14.9*  ?PLT 223 176  ? ? ?Assessment/Plan: ?41 y.o. G3P3003 postpartum day # 0 ? ?- Continue routine PP care ?- encouraged snug fitting bra and cabbage leaves for bottlefeeding.  ?- Discussed contraceptive options including implant, IUDs hormonal and non-hormonal, injection, pills/ring/patch, condoms, and NFP.  ?- Acute blood loss anemia - hemodynamically stable and asymptomatic; start po ferrous sulfate BID with stool softeners  ? ?Disposition: Does not desire Dc home today.  ? ? ? ?Gertie Fey, CNM ?07/14/2021 ?9:03 AM ? ? ? ? ?

## 2021-07-14 NOTE — Discharge Summary (Signed)
Obstetrical Discharge Summary ? ?Patient Name: Candice Mccall ?DOB: 01/31/1981 ?MRN: [id number] ? ?Date of Admission: 07/13/2021 ?Date of Delivery: 07/14/21 ?Delivered by: McVey CNM ?Date of Discharge: 07/15/2021 ? ?Primary OB: [place]  ?HWE:XHBZJIR'C last menstrual period was 10/18/2020. ?EDC Estimated Date of Delivery: 07/25/21 ?Gestational Age at Delivery: [redacted]w[redacted]d ? ?Antepartum complications:  ?1. Advanced maternal age, 41yo?2. Mast cell activation syndrome; significant food allergies and sensitvities; IgE mediated reactions.  ?3. Resolved [place] pregnancy ?4. POTS ?5. Depression, on Cymbalta daily.  ?6. Varicella NON-immune ? ?Admitting Diagnosis: Labor, 38wks ?Secondary Diagnosis: SVD, 1st deg perineal ? ?Patient Active Problem List  ? Diagnosis Date Noted  ? Labor and delivery, indication for care 07/13/2021  ? Colitis 02/04/2015  ? ? ?Augmentation: AROM ?Complications: None ?Intrapartum complications/course: admitted in active labor, epidural given for pain mgmt, AROM. Progression to C/C/+1 and delivery. See delivery notes.  ?Date of Delivery: 07/14/21 ?Delivered By: MMagda KielCNM ?Delivery Type: spontaneous vaginal delivery ?Anesthesia: epidural ?Placenta: spontaneous ?Laceration: 1st deg perineal ?Episiotomy: none ?Newborn Data: ?Live born female "Candice Mccall ?Birth Weight:  7lb 0.5oz ?APGAR: 9, 9 ? ?Newborn Delivery   ?Birth date/time: 07/14/2021 00:47:00 ?Delivery type: Vaginal, Spontaneous ?  ?  ?Postpartum Procedures: none ? ?Edinburgh:  ? ?  07/14/2021  ? 12:40 PM  ?Edinburgh Postnatal Depression Scale Screening Tool  ?I have been able to laugh and see the funny side of things. 2  ?I have looked forward with enjoyment to things. 2  ?I have blamed myself unnecessarily when things went wrong. 2  ?I have been anxious or worried for no good reason. 2  ?I have felt scared or panicky for no good reason. 0  ?Things have been getting on top of me. 2  ?I have been so unhappy that I have had difficulty sleeping. 2   ?I have felt sad or miserable. 2  ?I have been so unhappy that I have been crying. 2  ?The thought of harming myself has occurred to me. 0  ?Edinburgh Postnatal Depression Scale Total 16  ?  ? ? ?Post partum course:  ?Patient had an uncomplicated postpartum course.  By time of discharge on PPD#1, her pain was controlled on oral pain medications; she had appropriate lochia and was ambulating, voiding without difficulty and tolerating regular diet.  She was deemed stable for discharge to home.   ? ? ?Discharge Physical Exam:   ?BP 118/76 (BP Location: Right Arm)   Pulse 79   Temp 98.1 ?F (36.7 ?C) (Oral)   Resp 18   Ht '5\' 4"'$  (1.626 m)   Wt 69.4 kg   LMP 10/18/2020   SpO2 98%   Breastfeeding Unknown   BMI 26.26 kg/m?  ? ?General: NAD ?CV: RRR ?Pulm: CTABL, nl effort ?ABD: s/nd/nt, fundus firm and below the umbilicus ?Lochia: moderate ?Perineum: well approximated ?DVT Evaluation: LE non-ttp, no evidence of DVT on exam. ? ?Hemoglobin  ?Date Value Ref Range Status  ?07/14/2021 9.0 (L) 12.0 - 15.0 g/dL Final  ? ?HGB  ?Date Value Ref Range Status  ?11/13/2012 13.4 12.0 - 16.0 g/dL Final  ? ?HCT  ?Date Value Ref Range Status  ?07/14/2021 28.7 (L) 36.0 - 46.0 % Final  ?11/13/2012 39.2 35.0 - 47.0 % Final  ? ? ? ?Disposition: stable, discharge to home. ?Baby Feeding: formula ?Baby Disposition: home with mom ? ?Rh Immune globulin given:  ?Rubella vaccine given: immune ?Varicella vaccine given: NON-Immune, given prior to d/c ?Tdap vaccine given in AP or  PP setting: declined ?Flu vaccine given in AP or PP setting: declined ? ?Contraception: declines, considering spouse vasectomy ? ?Prenatal Labs:  ?Blood type/Rh O  Pos  ?Antibody screen neg  ?Rubella Immune  ?Varicella NON-Immune  ?RPR NR  ?HBsAg Neg  ?HIV NR  ?GC neg  ?Chlamydia neg  ?Genetic screening negative  ?1 hour GTT Random glucose 80  ?3 hour GTT    ?GBS  Neg  ? ? ? ?Plan:  ?Candice Mccall was discharged to home in good condition. ?Follow-up appointment with  delivering provider in 6 weeks. ? ?Discharge Medications: ?Allergies as of 07/15/2021   ? ?   Reactions  ? Lactose Intolerance (gi) Other (See Comments)  ? Unable to tolerate lactose products  ? Penicillins Hives, Other (See Comments)  ? Has patient had a PCN reaction causing immediate rash, facial/tongue/throat swelling, SOB or lightheadedness with hypotension: No ?Has patient had a PCN reaction causing severe rash involving mucus membranes or skin necrosis: No ?Has patient had a PCN reaction that required hospitalization No ?Has patient had a PCN reaction occurring within the last 10 years: No ?If all of the above answers are "NO", then may proceed with Cephalosporin use.  ? Sulfa Antibiotics Rash, Other (See Comments)  ? Joint pain  ? ?  ? ?  ?Medication List  ?  ? ?TAKE these medications   ? ?acetaminophen 325 MG tablet ?Commonly known as: Tylenol ?Take 2 tablets (650 mg total) by mouth every 4 (four) hours as needed (for pain scale < 4). ?  ?benzocaine-Menthol 20-0.5 % Aero ?Commonly known as: DERMOPLAST ?Apply 1 application. topically as needed for irritation (perineal discomfort). ?  ?DULoxetine 30 MG capsule ?Commonly known as: CYMBALTA ?Take 30 mg by mouth every evening. ?  ?ferrous sulfate 325 (65 FE) MG tablet ?Take 1 tablet (325 mg total) by mouth 2 (two) times daily with a meal. ?  ?ibuprofen 600 MG tablet ?Commonly known as: ADVIL ?Take 1 tablet (600 mg total) by mouth every 6 (six) hours. ?  ?prenatal multivitamin Tabs tablet ?Take 1 tablet by mouth daily at 12 noon. ?  ?traZODone 50 MG tablet ?Commonly known as: DESYREL ?TAKE 1-2 TABS AT BEDTIME AS NEEDED ?  ?witch hazel-glycerin pad ?Commonly known as: TUCKS ?Apply 1 application. topically as needed for hemorrhoids. ?  ? ?  ? ? ? Follow-up Information   ? ? McVey, Murray Hodgkins, CNM. Schedule an appointment as soon as possible for a visit in 6 week(s).   ?Specialty: Obstetrics and Gynecology ?Why: postpartum ?Contact information: ?[place] ?[place] [address] ?[phone] ? ? ?  ?  ? ? McVey, Murray Hodgkins, CNM Follow up in 2 week(s).   ?Specialty: Obstetrics and Gynecology ?Why: 2wk mood check ?Contact information: ?[address] ?[place] [address] ?[phone] ? ? ?  ?  ? ?  ?  ? ?  ? ? ?Signed: ? ?Gertie Fey, CNM ?07/15/2021  ?8:40 AM ? ? ?

## 2021-07-14 NOTE — TOC Initial Note (Signed)
Transition of Care (TOC) - Initial/Assessment Note  ? ? ?Patient Details  ?Name: Candice Mccall ?MRN: [id number] ?Date of Birth: [birth year] ? ?Transition of Care (TOC) CM/SW Contact:    ?Washburn, LCSWA ?Phone Number: ?07/14/2021, 1:19 PM ? ?Clinical Narrative:                 ? ?TOC consult for high Edinburgh screening score. CSW spoke to patient and patient reported history of PPD. CSW discussed reaching out to PCP when feeling symptoms of PPD. Patient reported having a therapist, strong familial support, and necessary baby items.  ? ? No further TOC needs at this time.  ?  ?  ? ? ?Patient Goals and CMS Choice ?  ?  ?  ? ?Expected Discharge Plan and Services ?  ?  ?  ?  ?  ?                ?  ?  ?  ?  ?  ?  ?  ?  ?  ?  ? ?Prior Living Arrangements/Services ?  ?  ?  ?       ?  ?  ?  ?  ? ?Activities of Daily Living ?Home Assistive Devices/Equipment: None ?ADL Screening (condition at time of admission) ?Patient's cognitive ability adequate to safely complete daily activities?: Yes ?Is the patient deaf or have difficulty hearing?: No ?Does the patient have difficulty seeing, even when wearing glasses/contacts?: No ?Does the patient have difficulty concentrating, remembering, or making decisions?: No ?Patient able to express need for assistance with ADLs?: Yes ?Does the patient have difficulty dressing or bathing?: No ?Independently performs ADLs?: Yes (appropriate for developmental age) ?Does the patient have difficulty walking or climbing stairs?: No ?Weakness of Legs: None ?Weakness of Arms/Hands: None ? ?Permission Sought/Granted ?  ?  ?   ?   ?   ?   ? ?Emotional Assessment ?  ?  ?  ?  ?  ?  ? ?Admission diagnosis:  Labor and delivery, indication for care [O75.9] ?Patient Active Problem List  ? Diagnosis Date Noted  ? Labor and delivery, indication for care 07/13/2021  ? Colitis 02/04/2015  ? ?PCP:  Emogene Morgan, MD ?Pharmacy:   ?[id number] [place] [place] [address]?Phone: [phone]: [phone]? ? ? ? ?Social Determinants of Health (SDOH) Interventions ?  ? ?Readmission Risk Interventions ?   ? View : No data to display.  ?  ?  ?  ? ? ? ?

## 2021-07-15 MED ORDER — IBUPROFEN 600 MG PO TABS
600.0000 mg | ORAL_TABLET | Freq: Four times a day (QID) | ORAL | 0 refills | Status: DC
Start: 2021-07-15 — End: 2023-01-22

## 2021-07-15 MED ORDER — WITCH HAZEL-GLYCERIN EX PADS
1.0000 | MEDICATED_PAD | CUTANEOUS | 12 refills | Status: DC | PRN
Start: 2021-07-15 — End: 2022-02-21

## 2021-07-15 MED ORDER — ACETAMINOPHEN 325 MG PO TABS: 650.0000 mg | ORAL_TABLET | ORAL | Status: DC | PRN

## 2021-07-15 MED ORDER — FERROUS SULFATE 325 (65 FE) MG PO TABS
325.0000 mg | ORAL_TABLET | Freq: Two times a day (BID) | ORAL | 3 refills | Status: DC
Start: 2021-07-15 — End: 2022-01-17

## 2021-07-15 MED ORDER — PRENATAL MULTIVITAMIN CH: 1.0000 | ORAL_TABLET | Freq: Every day | ORAL | Status: DC

## 2021-07-15 MED ORDER — BENZOCAINE-MENTHOL 20-0.5 % EX AERO: 1.0000 "application " | INHALATION_SPRAY | CUTANEOUS | Status: DC | PRN

## 2021-07-15 NOTE — Plan of Care (Incomplete)
Reviewed D/C instructions with pt and family. Pt verbalized understanding of teaching. Discharged to home via W/C. Pt to schedule f/u appt.  

## 2021-07-16 NOTE — Anesthesia Postprocedure Evaluation (Signed)
Anesthesia Post Note ? ?Patient: Candice Mccall ? ?Procedure(s) Performed: AN AD HOC LABOR EPIDURAL ? ?Anesthesia Type: Epidural ?Level of consciousness: awake and alert ?Pain management: pain level controlled ?Vital Signs Assessment: post-procedure vital signs reviewed and stable ?Respiratory status: spontaneous breathing, nonlabored ventilation and respiratory function stable ?Cardiovascular status: stable ?Postop Assessment: no headache, no backache and epidural receding ?Anesthetic complications: no ?Comments: Patient discharged before post op evaluation by anesthesia staff. Vitals and nursing notes reviewed with no apparent complications. ? ? ? ?No notable events documented. ? ? ?Last Vitals: There were no vitals filed for this visit.  ?Last Pain: There were no vitals filed for this visit. ? ?  ?  ?  ?  ?  ?  ? ?Arita Miss ? ? ? ? ? ?

## 2022-01-17 ENCOUNTER — Encounter: Payer: Self-pay | Admitting: Otolaryngology

## 2022-01-18 ENCOUNTER — Encounter: Payer: Self-pay | Admitting: Anesthesiology

## 2022-01-18 NOTE — Discharge Instructions (Signed)
[facility] ENDOSCOPIC SINUS SURGERY [facility]  What is Functional Endoscopic Sinus Surgery?  The Surgery involves making the natural openings of the sinuses larger by removing the bony partitions that separate the sinuses from the nasal cavity.  The natural sinus lining is preserved as much as possible to allow the sinuses to resume normal function after the surgery.  In some patients nasal polyps (excessively swollen lining of the sinuses) may be removed to relieve obstruction of the sinus openings.  The surgery is performed through the nose using lighted scopes, which eliminates the need for incisions on the face.  A septoplasty is a different procedure which is sometimes performed with sinus surgery.  It involves straightening the boy partition that separates the two sides of your nose.  A crooked or deviated septum may need repair if is obstructing the sinuses or nasal airflow.  Turbinate reduction is also often performed during sinus surgery.  The turbinates are bony proturberances from the side walls of the nose which swell and can obstruct the nose in patients with sinus and allergy problems.  Their size can be surgically reduced to help relieve nasal obstruction.  What Can Sinus Surgery Do For Me?  Sinus surgery can reduce the frequency of sinus infections requiring antibiotic treatment.  This can provide improvement in nasal congestion, post-nasal drainage, facial pressure and nasal obstruction.  Surgery will NOT prevent you from ever having an infection again, so it usually only for patients who get infections 4 or more times yearly requiring antibiotics, or for infections that do not clear with antibiotics.  It will not cure nasal allergies, so patients with allergies may still require medication to treat their allergies after surgery. Surgery may improve headaches related to sinusitis, however, some people will continue to  require medication to control sinus headaches related to allergies.  Surgery will do nothing for other forms of headache (migraine, tension or cluster).  What Are the Risks of Endoscopic Sinus Surgery?  Current techniques allow surgery to be performed safely with little risk, however, there are rare complications that patients should be aware of.  Because the sinuses are located around the eyes, there is risk of eye injury, including blindness, though again, this would be quite rare. This is usually a result of bleeding behind the eye during surgery, which can effect vision, though there are treatments to protect the vision and prevent permanent injury. More serious complications would include bleeding inside the brain cavity or damage to the brain.This happens when the fluid around the brain leaks out into the sinus cavity.  Again, all of these complications are uncommon, and spinal fluid leaks can be safely managed surgically if they occur.  The most common complication of sinus surgery is bleeding from the nose, which may require packing or cauterization of the nose.  Patients with polyps may experience recurrence of the polyps that would require revision surgery.  Alterations of sense of smell or injury to the tear ducts are also rare complications.   What is the Surgery Like, and what is the Recovery?  The Surgery usually takes a couple of hours to perform, and is usually performed under a general anesthetic (completely asleep).  Patients are usually discharged home after a couple of hours.  Sometimes during surgery it is necessary to pack the nose to control bleeding, and the packing is left in place for 24 - 48 hours, and removed by your surgeon.  If   a septoplasty was performed during the procedure, there is often a splint placed which must be removed after 5-7 days.   Discomfort: Pain is usually mild to moderate, and can be controlled by prescription pain medication or acetaminophen (Tylenol).   Aspirin, Ibuprofen (Advil, Motrin), or Naprosyn (Aleve) should be avoided, as they can cause increased bleeding.  Most patients feel sinus pressure like they have a bad head cold for several days.  Sleeping with your head elevated can help reduce swelling and facial pressure, as can ice packs over the face.  A humidifier may be helpful to keep the mucous and blood from drying in the nose.   Diet: There are no specific diet restrictions, however, you should generally start with clear liquids and a light diet of bland foods because the anesthetic can cause some nausea.  Advance your diet depending on how your stomach feels.  Taking your pain medication with food will often help reduce stomach upset which pain medications can cause.  Nasal Saline Irrigation: It is important to remove blood clots and dried mucous from the nose as it is healing.  This is done by having you irrigate the nose at least 3 - 4 times daily with a salt water solution.  We recommend using NeilMed Sinus Rinse (available at the drug store).  Fill the squeeze bottle with the solution, bend over a sink, and insert the tip of the squeeze bottle into the nose  of an inch.  Point the tip of the squeeze bottle towards the inside corner of the eye on the same side your irrigating.  Squeeze the bottle and gently irrigate the nose.  If you bend forward as you do this, most of the fluid will flow back out of the nose, instead of down your throat.   The solution should be warm, near body temperature, when you irrigate.   Each time you irrigate, you should use a full squeeze bottle.   Note that if you are instructed to use Nasal Steroid Sprays at any time after your surgery, irrigate with saline BEFORE using the steroid spray, so you do not wash it all out of the nose. Another product, Nasal Saline Gel (such as AYR Nasal Saline Gel) can be applied in each nostril 3 - 4 times daily to moisture the nose and reduce scabbing or crusting.  Bleeding:   Bloody drainage from the nose can be expected for several days, and patients are instructed to irrigate their nose frequently with salt water to help remove mucous and blood clots.  The drainage may be dark red or brown, though some fresh blood may be seen intermittently, especially after irrigation.  Do not blow you nose, as bleeding may occur. If you must sneeze, keep your mouth open to allow air to escape through your mouth.  If heavy bleeding occurs: Irrigate the nose with saline to rinse out clots, then spray the nose 3 - 4 times with Afrin Nasal Decongestant Spray.  The spray will constrict the blood vessels to slow bleeding.  Pinch the lower half of your nose shut to apply pressure, and lay down with your head elevated.  Ice packs over the nose may help as well. If bleeding persists despite these measures, you should notify your doctor.  Do not use the Afrin routinely to control nasal congestion after surgery, as it can result in worsening congestion and may affect healing.     Activity: Return to work varies among patients. Most patients will be out   of work at least 5 - 7 days to recover.  Patient may return to work after they are off of narcotic pain medication, and feeling well enough to perform the functions of their job.  Patients must avoid heavy lifting (over 10 pounds) or strenuous physical for 2 weeks after surgery, so your employer may need to assign you to light duty, or keep you out of work longer if light duty is not possible.  NOTE: you should not drive, operate dangerous machinery, do any mentally demanding tasks or make any important legal or financial decisions while on narcotic pain medication and recovering from the general anesthetic.    Call Your Doctor Immediately if You Have Any of the Following: Bleeding that you cannot control with the above measures Loss of vision, double vision, bulging of the eye or black eyes. Fever over 101 degrees Neck stiffness with severe headache,  fever, nausea and change in mental state. You are always encouraged to call anytime with concerns, however, please call with requests for pain medication refills during office hours.  Office Endoscopy: During follow-up visits your doctor will remove any packing or splints that may have been placed and evaluate and clean your sinuses endoscopically.  Topical anesthetic will be used to make this as comfortable as possible, though you may want to take your pain medication prior to the visit.  How often this will need to be done varies from patient to patient.  After complete recovery from the surgery, you may need follow-up endoscopy from time to time, particularly if there is concern of recurrent infection or nasal polyps.  

## 2022-02-21 ENCOUNTER — Ambulatory Visit: Payer: BC Managed Care – PPO | Admitting: Anesthesiology

## 2022-02-21 ENCOUNTER — Encounter
Admission: RE | Disposition: A | Discharge status: Home / Self Care | Payer: Self-pay | Source: Home / Self Care | Attending: Otolaryngology

## 2022-02-21 ENCOUNTER — Encounter: Payer: Self-pay | Admitting: Otolaryngology

## 2022-02-21 ENCOUNTER — Other Ambulatory Visit: Payer: Self-pay

## 2022-02-21 ENCOUNTER — Ambulatory Visit
Admission: RE | Admit: 2022-02-21 | Discharge: 2022-02-21 | Disposition: A | Discharge status: Home / Self Care | Payer: BC Managed Care – PPO | Attending: Otolaryngology | Admitting: Otolaryngology

## 2022-02-21 DIAGNOSIS — Z79899 Other long term (current) drug therapy: Secondary | ICD-10-CM | POA: Diagnosis not present

## 2022-02-21 DIAGNOSIS — J320 Chronic maxillary sinusitis: Secondary | ICD-10-CM | POA: Diagnosis present

## 2022-02-21 HISTORY — PX: MAXILLARY ANTROSTOMY: SHX2003

## 2022-02-21 HISTORY — PX: IMAGE GUIDED SINUS SURGERY: SHX6570

## 2022-02-21 HISTORY — DX: Migraine, unspecified, not intractable, without status migrainosus: G43.909

## 2022-02-21 LAB — POCT PREGNANCY, URINE: Preg Test, Ur: NEGATIVE

## 2022-02-21 SURGERY — SINUS SURGERY, WITH IMAGING GUIDANCE
Anesthesia: General | Site: Nose | Laterality: Bilateral

## 2022-02-21 SURGERY — SINUS SURGERY, WITH IMAGING GUIDANCE
Anesthesia: General

## 2022-02-21 MED ORDER — SUCCINYLCHOLINE CHLORIDE 200 MG/10ML IV SOSY
PREFILLED_SYRINGE | INTRAVENOUS | Status: DC | PRN
  Administered 2022-02-21: 60 mg via INTRAVENOUS

## 2022-02-21 MED ORDER — DEXTROSE 5 % IV SOLN: 600.0000 mg | Freq: Once | INTRAVENOUS | Status: DC

## 2022-02-21 MED ORDER — SCOPOLAMINE 1 MG/3DAYS TD PT72
1.0000 | MEDICATED_PATCH | TRANSDERMAL | Status: DC
  Administered 2022-02-21: 1.5 mg via TRANSDERMAL

## 2022-02-21 MED ORDER — LACTATED RINGERS IV SOLN: INTRAVENOUS | Status: DC

## 2022-02-21 MED ORDER — PROPOFOL 10 MG/ML IV BOLUS
INTRAVENOUS | Status: DC | PRN
  Administered 2022-02-21: 75 mg via INTRAVENOUS

## 2022-02-21 MED ORDER — CEPHALEXIN 500 MG PO CAPS: 500.0000 mg | ORAL_CAPSULE | Freq: Two times a day (BID) | ORAL | 0 refills | Status: DC

## 2022-02-21 MED ORDER — OXYMETAZOLINE HCL 0.05 % NA SOLN
2.0000 | Freq: Once | NASAL | Status: AC
  Administered 2022-02-21: 2 via NASAL

## 2022-02-21 MED ORDER — ONDANSETRON HCL 4 MG/2ML IJ SOLN
INTRAMUSCULAR | Status: DC | PRN
  Administered 2022-02-21: 4 mg via INTRAVENOUS

## 2022-02-21 MED ORDER — ACETAMINOPHEN 500 MG PO TABS
1000.0000 mg | ORAL_TABLET | Freq: Once | ORAL | Status: AC
  Administered 2022-02-21: 1000 mg via ORAL

## 2022-02-21 MED ORDER — PHENYLEPHRINE HCL 0.5 % NA SOLN
NASAL | Status: DC | PRN
  Administered 2022-02-21: 15 mL via TOPICAL

## 2022-02-21 MED ORDER — HYDROCODONE-ACETAMINOPHEN 5-325 MG PO TABS: 1.0000 | ORAL_TABLET | Freq: Four times a day (QID) | ORAL | 0 refills | Status: AC | PRN

## 2022-02-21 MED ORDER — PREDNISONE 10 MG PO TABS: ORAL_TABLET | ORAL | 0 refills | Status: DC

## 2022-02-21 MED ORDER — OXYCODONE HCL 5 MG/5ML PO SOLN: 5.0000 mg | Freq: Once | ORAL | Status: DC | PRN

## 2022-02-21 MED ORDER — DEXMEDETOMIDINE HCL IN NACL 80 MCG/20ML IV SOLN
INTRAVENOUS | Status: DC | PRN
  Administered 2022-02-21: 4 ug via BUCCAL

## 2022-02-21 MED ORDER — FENTANYL CITRATE PF 50 MCG/ML IJ SOSY: 25.0000 ug | PREFILLED_SYRINGE | INTRAMUSCULAR | Status: DC | PRN

## 2022-02-21 MED ORDER — LIDOCAINE HCL (CARDIAC) PF 100 MG/5ML IV SOSY
PREFILLED_SYRINGE | INTRAVENOUS | Status: DC | PRN
  Administered 2022-02-21: 40 mg via INTRAVENOUS

## 2022-02-21 MED ORDER — DEXTROSE 5 % IV SOLN
2000.0000 mg | Freq: Once | INTRAVENOUS | Status: AC
Start: 2022-02-21 — End: 2022-02-21
  Administered 2022-02-21: 2000 mg via INTRAVENOUS

## 2022-02-21 MED ORDER — DEXAMETHASONE SODIUM PHOSPHATE 4 MG/ML IJ SOLN
INTRAMUSCULAR | Status: DC | PRN
  Administered 2022-02-21: 10 mg via INTRAVENOUS

## 2022-02-21 MED ORDER — ONDANSETRON HCL 4 MG/2ML IJ SOLN: 4.0000 mg | Freq: Once | INTRAMUSCULAR | Status: DC | PRN

## 2022-02-21 MED ORDER — OXYCODONE HCL 5 MG PO TABS: 5.0000 mg | ORAL_TABLET | Freq: Once | ORAL | Status: DC | PRN

## 2022-02-21 MED ORDER — FENTANYL CITRATE (PF) 100 MCG/2ML IJ SOLN
INTRAMUSCULAR | Status: DC | PRN
  Administered 2022-02-21: 50 ug via INTRAVENOUS
  Administered 2022-02-21: 25 ug via INTRAVENOUS

## 2022-02-21 MED ORDER — MIDAZOLAM HCL 5 MG/5ML IJ SOLN
INTRAMUSCULAR | Status: DC | PRN
  Administered 2022-02-21: 2 mg via INTRAVENOUS

## 2022-02-21 SURGICAL SUPPLY — 25 items
CABLE TRUDI DISPOSABLE (ENT DISPOSABLE) ×2 IMPLANT
CANISTER SUCT 1200ML W/VALVE (MISCELLANEOUS) ×1 IMPLANT
ELECT REM PT RETURN 9FT ADLT (ELECTROSURGICAL) ×1
ELECTRODE REM PT RTRN 9FT ADLT (ELECTROSURGICAL) ×1 IMPLANT
GLOVE SURG GAMMEX PI TX LF 7.5 (GLOVE) ×2 IMPLANT
GOWN STRL REUS W/ TWL LRG LVL3 (GOWN DISPOSABLE) ×1 IMPLANT
GOWN STRL REUS W/TWL LRG LVL3 (GOWN DISPOSABLE) ×1
IV NS 500ML (IV SOLUTION) ×1
IV NS 500ML BAXH (IV SOLUTION) ×1 IMPLANT
KIT TURNOVER KIT A (KITS) ×1 IMPLANT
NDL ANESTHESIA 27G X 3.5 (NEEDLE) ×1 IMPLANT
NDL HYPO 27GX1-1/4 (NEEDLE) ×1 IMPLANT
NEEDLE ANESTHESIA  27G X 3.5 (NEEDLE) ×1
NEEDLE ANESTHESIA 27G X 3.5 (NEEDLE) ×1 IMPLANT
NEEDLE HYPO 27GX1-1/4 (NEEDLE) ×1 IMPLANT
NS IRRIG 500ML POUR BTL (IV SOLUTION) ×1 IMPLANT
PACK ENT CUSTOM (PACKS) ×1 IMPLANT
PACKING NASAL EPIS 4X2.4 XEROG (MISCELLANEOUS) ×2 IMPLANT
PATTIES SURGICAL .5 X3 (DISPOSABLE) ×1 IMPLANT
SOL ANTI-FOG 6CC FOG-OUT (MISCELLANEOUS) ×1 IMPLANT
STRAP BODY AND KNEE 60X3 (MISCELLANEOUS) ×1 IMPLANT
SYR 3ML LL SCALE MARK (SYRINGE) ×1 IMPLANT
TOWEL OR 17X26 4PK STRL BLUE (TOWEL DISPOSABLE) ×1 IMPLANT
TRACKER DISPOSABLE PAITIENT (MISCELLANEOUS) ×1 IMPLANT
WATER STERILE IRR 250ML POUR (IV SOLUTION) ×1 IMPLANT

## 2022-02-21 NOTE — Anesthesia Procedure Notes (Signed)
Procedure Name: Intubation Date/Time: 02/21/2022 2:24 PM  Performed by: Patience Musca., CRNAPre-anesthesia Checklist: Patient identified, Patient being monitored, Timeout performed, Emergency Drugs available and Suction available Patient Re-evaluated:Patient Re-evaluated prior to induction Oxygen Delivery Method: Circle system utilized Preoxygenation: Pre-oxygenation with 100% oxygen Induction Type: IV induction Ventilation: Mask ventilation without difficulty Laryngoscope Size: 3 and McGraph Grade View: Grade I Tube type: Oral Tube size: 6.5 mm Number of attempts: 1 Airway Equipment and Method: Stylet Placement Confirmation: ETT inserted through vocal cords under direct vision, positive ETCO2 and breath sounds checked- equal and bilateral Secured at: 20 cm Tube secured with: Tape Dental Injury: Teeth and Oropharynx as per pre-operative assessment

## 2022-02-21 NOTE — Transfer of Care (Signed)
Immediate Anesthesia Transfer of Care Note  Patient: Candice Mccall  Procedure(s) Performed: IMAGE GUIDED SINUS SURGERY (Bilateral: Nose) MAXILLARY ANTROSTOMY (Bilateral: Nose)  Patient Location: PACU  Anesthesia Type: General  Level of Consciousness: awake, alert  and patient cooperative  Airway and Oxygen Therapy: Patient Spontanous Breathing and Patient connected to supplemental oxygen  Post-op Assessment: Post-op Vital signs reviewed, Patient's Cardiovascular Status Stable, Respiratory Function Stable, Patent Airway and No signs of Nausea or vomiting  Post-op Vital Signs: Reviewed and stable  Complications: No notable events documented.

## 2022-02-21 NOTE — Anesthesia Postprocedure Evaluation (Signed)
Anesthesia Post Note  Patient: Candice Mccall  Procedure(s) Performed: IMAGE GUIDED SINUS SURGERY (Bilateral: Nose) MAXILLARY ANTROSTOMY (Bilateral: Nose)  Patient location during evaluation: PACU Anesthesia Type: General Level of consciousness: awake and alert Pain management: pain level controlled Vital Signs Assessment: post-procedure vital signs reviewed and stable Respiratory status: spontaneous breathing, nonlabored ventilation, respiratory function stable and patient connected to nasal cannula oxygen Cardiovascular status: blood pressure returned to baseline and stable Postop Assessment: no apparent nausea or vomiting Anesthetic complications: no   No notable events documented.   Last Vitals:  Vitals:   02/21/22 1500 02/21/22 1507  BP: 120/80 123/77  Pulse: 73 63  Resp: 12 16  Temp:  (!) 36.3 C  SpO2: 100% 100%    Last Pain:  Vitals:   02/21/22 1507  TempSrc:   PainSc: 0-No pain                 Arita Miss

## 2022-02-21 NOTE — Anesthesia Preprocedure Evaluation (Signed)
Anesthesia Evaluation  Patient identified by MRN, date of birth, ID band Patient awake    Reviewed: Allergy & Precautions, NPO status , Patient's Chart, lab work & pertinent test results  History of Anesthesia Complications Negative for: history of anesthetic complications  Airway Mallampati: II  TM Distance: >3 FB Neck ROM: Full    Dental no notable dental hx. (+) Teeth Intact   Pulmonary neg pulmonary ROS, neg sleep apnea, neg COPD, Patient abstained from smoking.Not current smoker   Pulmonary exam normal breath sounds clear to auscultation       Cardiovascular Exercise Tolerance: Good METS(-) hypertension(-) CAD and (-) Past MI negative cardio ROS (-) dysrhythmias  Rhythm:Regular Rate:Normal - Systolic murmurs    Neuro/Psych  Headaches PSYCHIATRIC DISORDERS Anxiety        GI/Hepatic ,neg GERD  ,,(+)     (-) substance abuse    Endo/Other  neg diabetes    Renal/GU negative Renal ROS     Musculoskeletal   Abdominal   Peds  Hematology   Anesthesia Other Findings Past Medical History: No date: Anxiety 2017: Colitis     Comment:  thought abdominal pain was colitis but it is not. No date: Eustachian tube disorder, right No date: Migraine headache     Comment:  "couple times per month"  Reproductive/Obstetrics                              Anesthesia Physical Anesthesia Plan  ASA: 2  Anesthesia Plan: General   Post-op Pain Management: Fentanyl IV and Tylenol PO (pre-op)   Induction: Intravenous  PONV Risk Score and Plan: 4 or greater and Ondansetron, Dexamethasone, Scopolamine patch - Pre-op and Midazolam  Airway Management Planned: Oral ETT  Additional Equipment: None  Intra-op Plan:   Post-operative Plan: Extubation in OR  Informed Consent: I have reviewed the patients History and Physical, chart, labs and discussed the procedure including the risks, benefits and  alternatives for the proposed anesthesia with the patient or authorized representative who has indicated his/her understanding and acceptance.     Dental advisory given  Plan Discussed with: CRNA and Surgeon  Anesthesia Plan Comments: (Discussed risks of anesthesia with patient, including PONV, sore throat, lip/dental/eye damage. Rare risks discussed as well, such as cardiorespiratory and neurological sequelae, and allergic reactions. Discussed the role of CRNA in patient's perioperative care. Patient understands. Patient has listed allergy to PCN - rash/hives Severe blistering skin reaction (SJS/TEN)? no Liver or kidney injury caused by PCN? no Hemolytic anemia from PCN? no Drug fever? no Painful swollen joints? no Severe reaction involving inside of mouth, eye, or genital ulcers? no Based on current evidence Alfonse Alpers et al, J Allergy Clin Immunol Pract, 2019), will proceed with cefazolin use: Yes  )         Anesthesia Quick Evaluation

## 2022-02-21 NOTE — Op Note (Signed)
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  Surg:  [facility]  Anes:  GOT  EBL: 20 mL  Comp: None  Findings: Retained uncinate process on both sides with a small natural ostium still evident and a bridge of tissue beneath it.  The right side had evidence of granulation tissue where the drainage continued to recur.  Procedure: Patient was brought to the operating room and placed in a supine position.  She was given general anesthesia by oral endotracheal intubation.  Once patient was asleep the image guided system was brought in.  She was placed on the head mat to align her eyes and the face was registered to the CT scan in the image guided system.  There appeared to be good alignment of the system.  Her nose was prepped with phenylephrine and lidocaine on cottonoid pledgets in the nose.  She was then prepped and draped in sterile fashion.  The cottonoid pledgets were removed on the left side to visualize this first.  1 mL of 1% Xylocaine with epi 1: 100,000 was used for infiltration at the anterior and posterior roots of the middle turbinate.  Middle turbinate was then infractured and a 30 degree scope was used to visualize the left middle meatus.  There was a previous opening made posteriorly to the natural ostium.  Some of the retained uncinate process was removed and then the bridge of tissue was removed as well.  This now opened up the maxillary antrum to include the natural ostium on this side.  There is minimal bleeding here.    The right side was then visualized with the 30 degree scope.  The anterior and posterior roots of the middle turbinate were infiltrated with 1 mL of Xylocaine with epinephrine like the other side.  The retained uncinate process was then removed to visualize the  opening of the natural ostium.  This is where the granulation tissue was and this was removed as well.  The opening was clear now and the antrum including the natural ostium.  There was minimal bleeding from the tissue here.  The left side was revisualized and there is no further bleeding.  The antrum was open and clear.  Xerogel was placed along the anterior wall of the maxillary sinus to help prevent bleeding and scabbing in this area.  The right side was then visualized with the scopes again and there is no further bleeding.  Xerogel was placed here as well like the other side.  The patient tolerated the procedure well.  She was awakened and taken to the recovery room in satisfactory condition.  There were no operative complications.  Dispo:   To PACU to be discharged home  Plan: To follow-up in the office next week to make sure the areas are clear.  She will start saline flushes tomorrow.  She will rest at home through the weekend.  Huey Romans  02/21/2022 5:31 PM

## 2022-02-21 NOTE — H&P (Signed)
H&P has been reviewed and patient reevaluated, no changes necessary. To be downloaded later.  

## 2022-02-22 ENCOUNTER — Encounter: Payer: Self-pay | Admitting: Otolaryngology

## 2022-02-25 LAB — SURGICAL PATHOLOGY

## 2022-09-03 IMAGING — US US OB COMP LESS 14 WK
1 series · 14 of 28 positions shown · non-contrast
Comparison: None.

CLINICAL DATA: Vaginal bleeding and cramping since this morning.
Eleven weeks 2 days by outside ultrasound

EXAM:
OBSTETRIC <14 WK ULTRASOUND
TECHNIQUE: Transabdominal ultrasound was performed for evaluation of the
gestation as well as the maternal uterus and adnexal regions.

[Series 1: us ob less than 14 weeks with ob transvaginal · 14 of 66 slices shown]
[im 3/66]
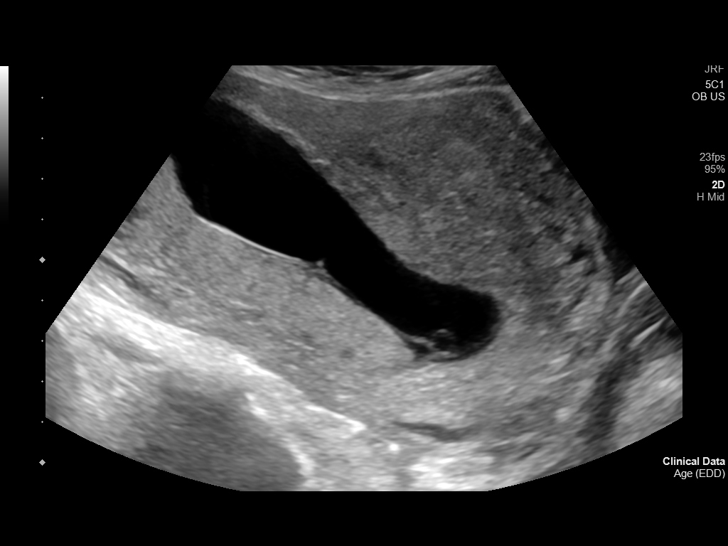
[im 8/66]
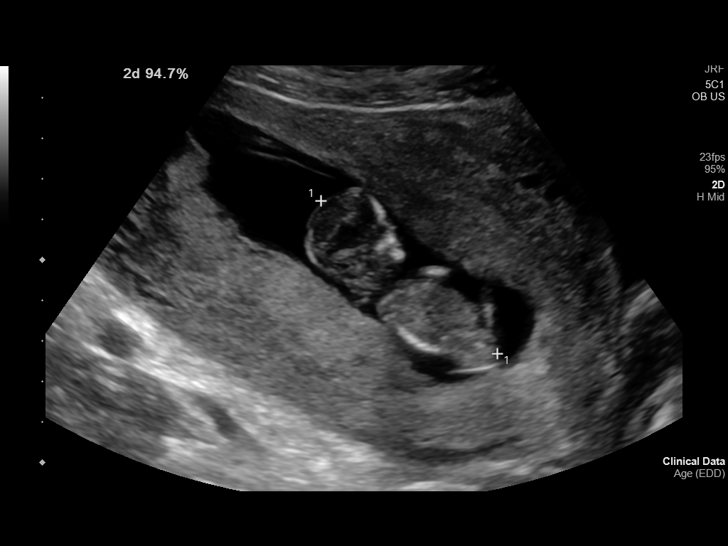
[im 13/66]
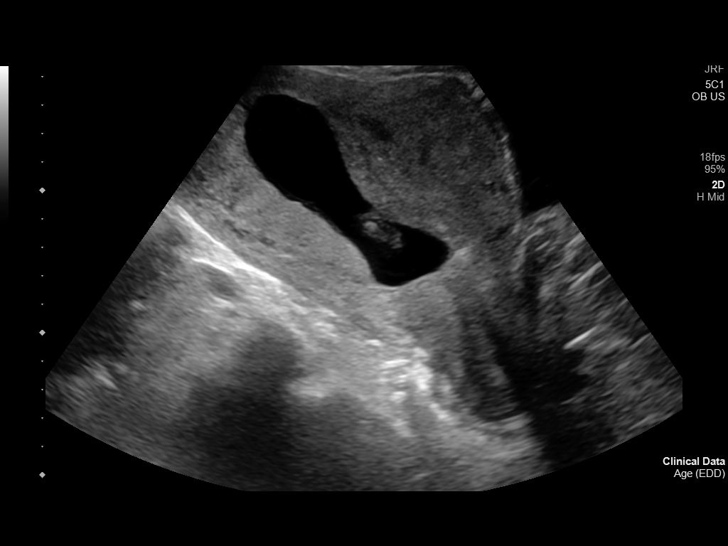
[im 17/66]
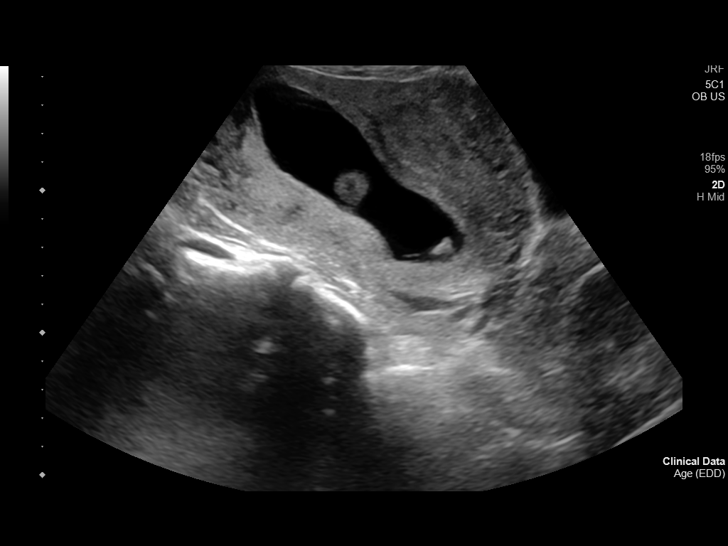
[im 22/66]
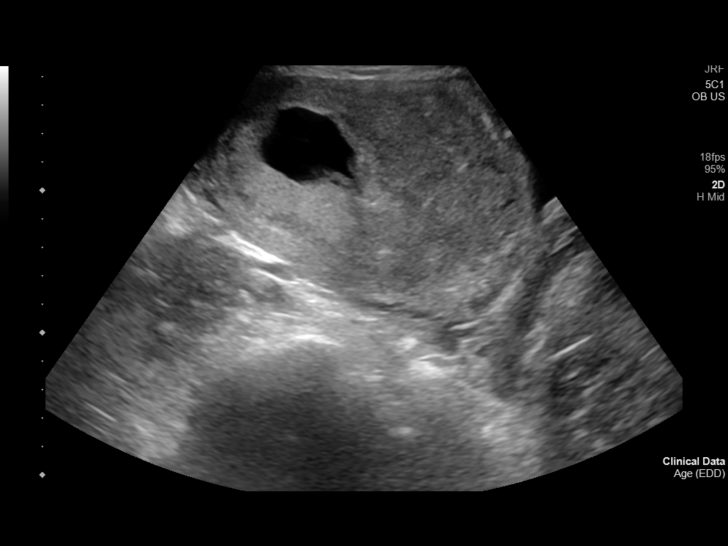
[im 27/66]
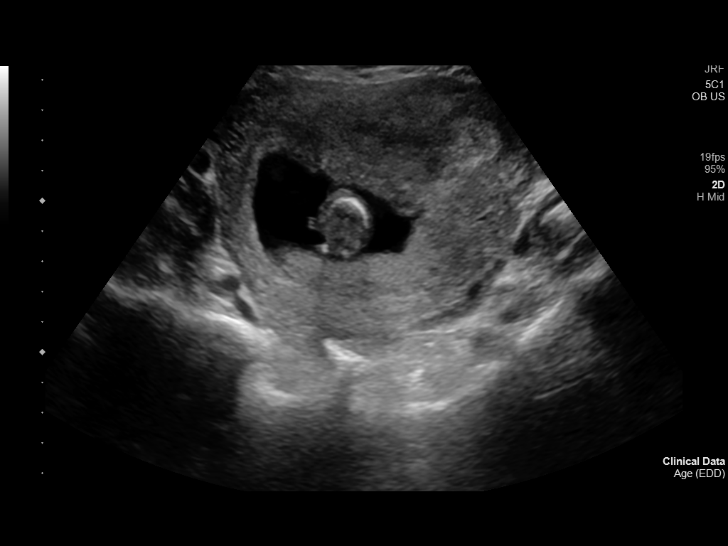
[im 32/66]
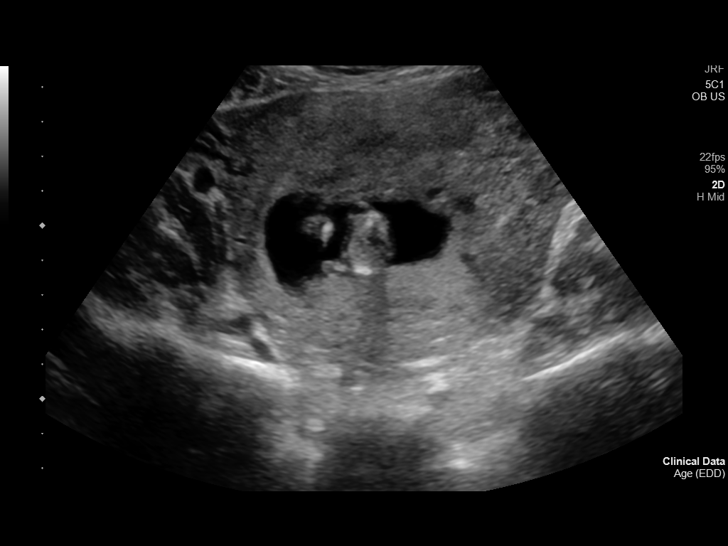
[im 37/66]
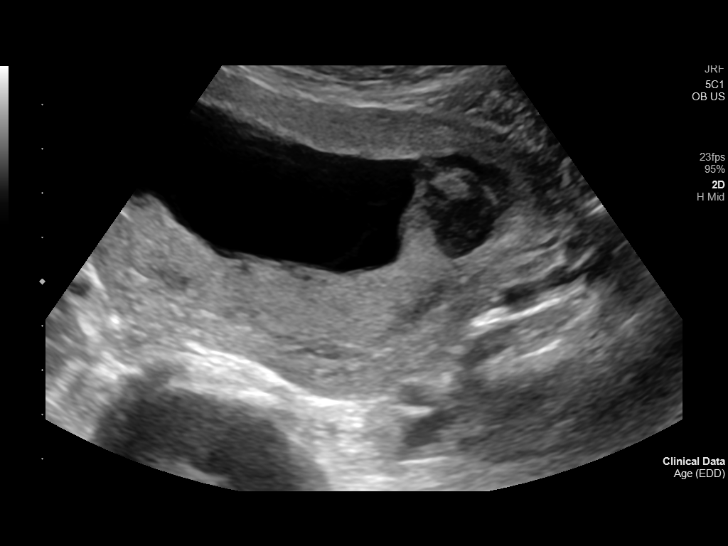
[im 41/66]
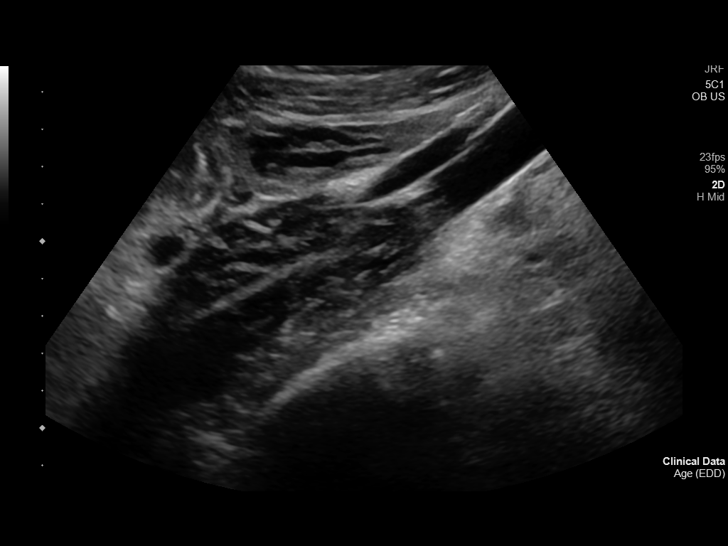
[im 46/66]
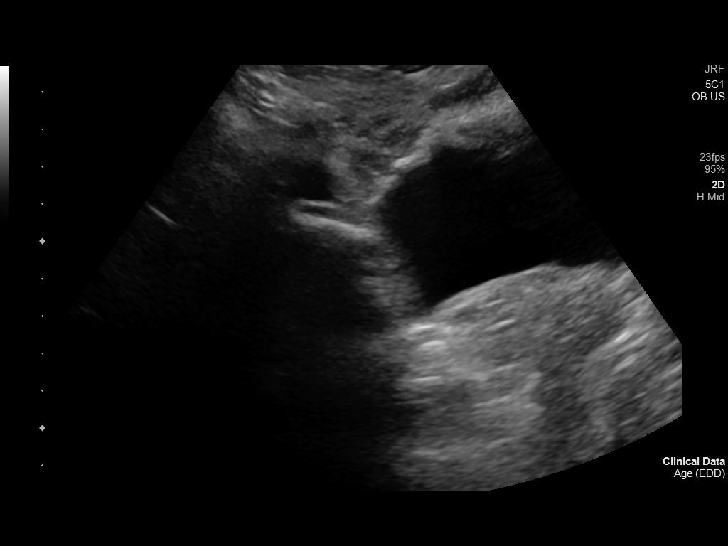
[im 51/66]
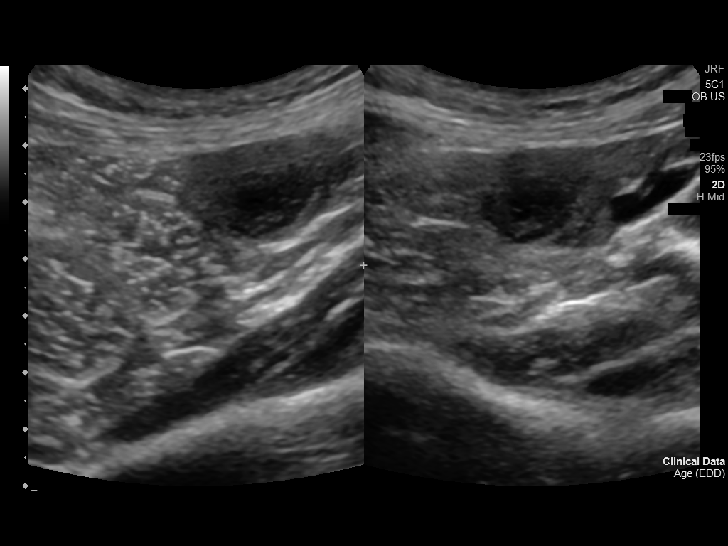
[im 56/66]
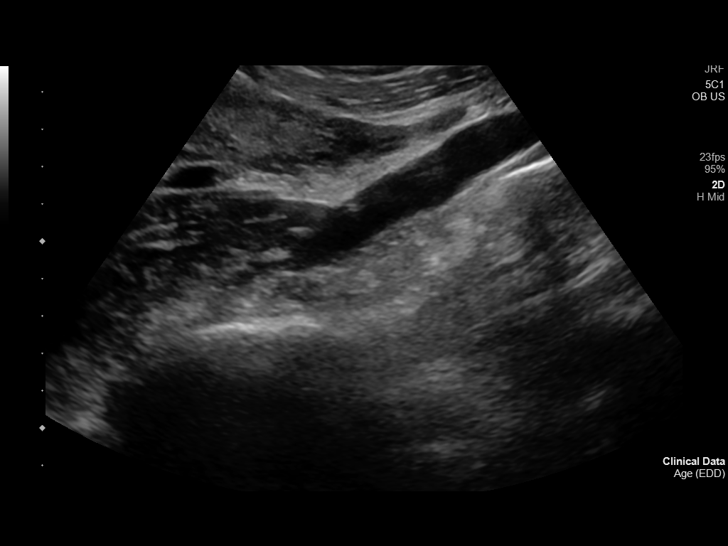
[im 61/66]
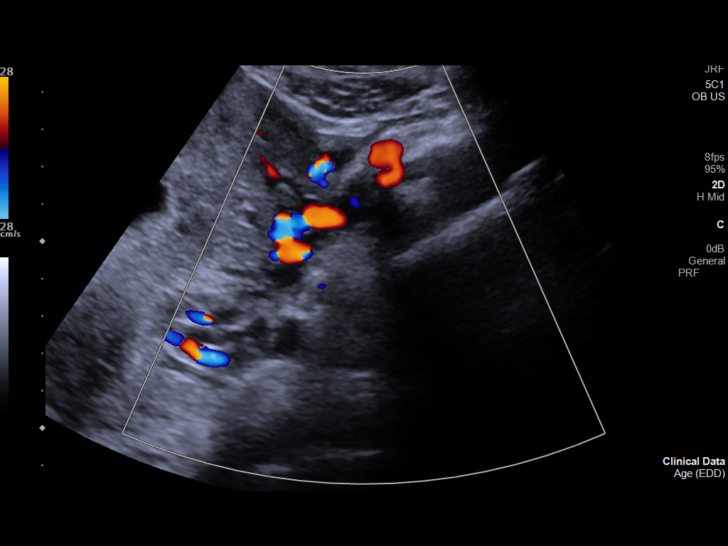
[im 66/66]
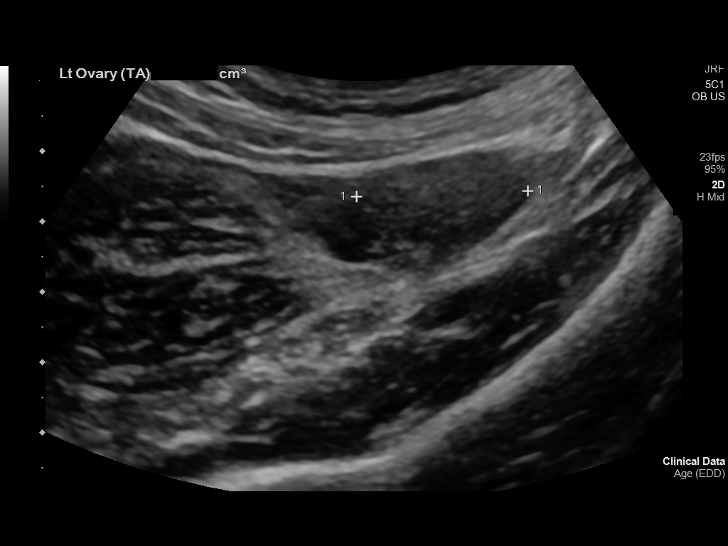

[14 of 28 positions shown; findings below may reference images not displayed]

FINDINGS: Intrauterine gestational sac: Present

Yolk sac:  Present

Embryo:  Present

Cardiac Activity: Present

Heart Rate: 178 bpm

CRL:   57.3 mm   12 w 2 d                  US EDC: 07/18/2021

Subchorionic hemorrhage: Small to moderate volume subchorionic
hemorrhage identified about the left superior sac including on image
40. Example 3.9 x 2.1 x 1.8 cm

Maternal uterus/adnexae: Normal left ovary. Right ovarian hypoechoic
lesion of 1.8 cm is most likely a corpus luteal cyst.

No significant free fluid.
IMPRESSION: Intrauterine gestation of approximately 12 weeks 2 days with fetal
heart rate of 178 beats per minute.

Small to moderate volume subchorionic hemorrhage.

Right ovarian hypoechoic lesion is likely a corpus luteal cyst.

## 2022-12-19 ENCOUNTER — Other Ambulatory Visit: Payer: Self-pay | Admitting: Obstetrics and Gynecology

## 2023-01-07 NOTE — H&P (Signed)
Preoperative History and Physical  Chief Complaint: Candice Mccall is a 42 y.o. [phone] here for surgical management of Endometriosis, chronic pelvic pain, menorrhagia with regular cycle, dysmenorrhea.   No significant preoperative concerns.  History of Present Illness: 42 y.o. [id number] female who presents to discuss a long history of pelvic pain and management.   She states that since about March (she had a baby about 18 months ago) she started bleeding about 7-9 days before her period was supposed to start.  She would have this bleeding and then her actual period would start.  She would have to take 3-4 ibuprofen every few hours in order to be able to do daily activities of living.  She also notes bloating. She controls her Mast Cell Activation and maintains a good diet.  This bloating is not related to her other syndrome.  The bloating resolves after her period is over. She also sees pelvic floor physical therapy.  She notes dyspareunia, dyschezia, dysuria. The dyschezia and dysuria are generally when she has her period. These are much worse when she is not taking advil.     She has a history of endometriosis and is not sure of what this is.     She had a surgery for endoemtriosis and was told it was mild. She had a pregnancy afterward. She had similar symptoms afterwards and had another laparoscopy where nothing was found.    After the delivery of her baby (about 6 months) she had an episode of very heavy bleeding, soaking the bed sheets. She will bleed for 7-9 days prior to her time, actual period is to start.     From Julien Girt, NP on 11/25/2022: "She reports a history of chronic pelvic pain and endometriosis after a diagnostic laparoscopy back in 2014. She states her most recent pain started back in March, she believes this is a flare up of her endometriosis. She complains of cramping 2 weeks prior to her period, pain with defecation, dyspareunia, and abnormal bleeding. She currently has been  taking ibuprofen 4 times day with no relief, and supplements with magnesium. She has previously tried COC's and progesterone only contraception with no improvement and side effects of suicidal ideation. She does see a pelvic floor therapist.    Reports she had some removal of the tissue back in 2014 with improvement, she then got pregnant and had worsening symptoms. She had another laparoscopic surgery in 2017. Her most recent pregnancy was 16 months ago, and has had worsening symptoms and heavier periods as well.    Reports having some hot flashes at night, she does have periods every month, ranging from 22-30 days. Her periods last 5-8 days with severe dysmenorrhea 2 weeks prior and throughout menses. Her bleeding starts 1 week prior to her period where she can wear a pad, then when her period starts it is heavy with clots, reports changing her pads every hour.    Pelvic ultrasound at [facility] on 09/30/2022: FINDINGS:   UTERUS/CERVIX: The uterus was anteverted, "heterogeneous" and measures 11.3 x 5.5 x 8.0 cm. No focal myometrial mass was seen.  The endometrium was normal in thickness, measuring 0.72 cm.   OVARIES: The ovaries were seen well transvaginally. Small cystic areas were seen within both ovaries compatible with follicles. Hemorrhagic left corpus luteum. Appropriate flow was documented on color Doppler imaging. The right ovary measured 3.2 x 2.5 x 2.6 cm and the left ovary measured 3.8 x 2.4 x 2.0 cm.   OTHER: Trace hypoechoic free  fluid in the right pelvis.   IMPRESSION:   Unremarkable pelvic ultrasound.   Today she requests to have her ovaries removed during the surgery.   Proposed surgery: Robot assisted total laparoscopic hysterectomy, bilateral salpingectomy, cystoscopy   Past Medical History:  Diagnosis Date   Abdominal pain    Blood in stool    Cortisol deficiency (CMS/HHS-HCC)    Resolved   Diarrhea    Endometriosis of uterus 2014   Fever    Hashimoto's thyroiditis     History of abnormal cervical Pap smear    Mast cell activation syndrome (CMS/HHS-HCC)    Migraines    POTS (postural orthostatic tachycardia syndrome)    Sacroiliitis (CMS-HCC)    Past Surgical History:  Procedure Laterality Date   FUNCTIONAL ENDOSCOPIC SINUS SURGERY  2011   BIOPSY LYMPH NODE SUPERFICIAL Left 02/12/2012   Procedure: BIOPSY LYMPH NODE SUPERFICIAL;  Surgeon: Christy Sartorius, MD;  Location: [facility];  Service: General Surgery;  Laterality: Left;  General/LMA   ESOPHAGOGASTRODOUDENOSCOPY W/BIOPSY N/A 12/14/2014   Procedure: ESOPHAGOGASTRODUODENOSCOPY;  Surgeon: Jonny Ruiz, MD;  Location: [facility];  Service: Gastroenterology;  Laterality: N/A;   COLONOSCOPY W/BIOPSY N/A 12/14/2014   Procedure: COLONOSCOPY ;  Surgeon: Jonny Ruiz, MD;  Location: [facility];  Service: Gastroenterology;  Laterality: N/A;   CAPSULE ENDOSCOPY  02/07/2015   Local Scattered Inflammation   OB History  Gravida Para Term Preterm AB Living  3 3 3     3   SAB IAB Ectopic Molar Multiple Live Births            3    # Outcome Date GA Lbr Len/2nd Weight Sex Type Anes PTL Lv  3 Term 07/14/21 [redacted]w[redacted]d 11:02 / 00:15 3.19 kg (7 lb 0.5 oz) M Vag-Spont EPI  LIV  2 Term 11/11/13 [redacted]w[redacted]d 05:25 / 00:05 2.631 kg (5 lb 12.8 oz) F Vag-Spont EPI N LIV  1 Term 2012 [redacted]w[redacted]d  3.175 kg (7 lb) F Vag-Spont   LIV  Patient denies any other pertinent gynecologic issues.   Current Outpatient Medications on File Prior to Visit  Medication Sig Dispense Refill   acetaminophen (TYLENOL) 325 MG tablet Take by mouth     azelaic acid (FINACEA) 15 % topical gel 1(ONE) APPLICATION(S) TOPICAL EVERY DAY AT NIGHT     cephalexin (KEFLEX) 500 MG capsule Take 500 mg by mouth 2 (two) times daily     DULoxetine (CYMBALTA) 30 MG DR capsule Take 40 mg by mouth once daily  3   L. acidophilus/Bifid. animalis 10 billion cell Cap Take by mouth     predniSONE (DELTASONE) 5 MG tablet Take  20 mg po qdaily x 3 days, then take 10 mg po qdaily x 3 days, then take 5 mg po qdaily x 3 days than stop.     rizatriptan (MAXALT) 5 MG tablet Take by mouth     traZODone (DESYREL) 50 MG tablet      No current facility-administered medications on file prior to visit.   Allergies  Allergen Reactions   Lactulose Other (See Comments)    Other reaction(s): Other (See Comments)  Unable to tolerate lactose products   Penicillins Rash, Hives and Other (See Comments)    TOLERATED CEFAZOLIN    Has patient had a PCN reaction causing immediate rash, facial/tongue/throat swelling, SOB or lightheadedness with hypotension: No  Has patient had a PCN reaction causing severe rash involving mucus membranes or skin necrosis: No  Has patient had  a PCN reaction that required hospitalization No  Has patient had a PCN reaction occurring within the last 10 years: No  If all of the above answers are "NO", then may proceed with Cephalosporin use.   Sulfa (Sulfonamide Antibiotics) Rash   Adhesive Tape-Silicones Rash    Bandaids and covering for epidural   Latex Rash    Gloves, balloons, exercise bands    Social History:   reports that she has never smoked. She has never used smokeless tobacco. She reports that she does not drink alcohol and does not use drugs.  Family History  Problem Relation Name Age of Onset   High blood pressure (Hypertension) Father Onalee Hua    Hyperlipidemia (Elevated cholesterol) Father Onalee Hua    Diabetes Brother Leonel Ramsay   Obesity Maternal Uncle Calvin    Coronary Artery Disease (Blocked arteries around heart) Maternal Uncle Calvin        N   Alzheimer's disease Maternal Grandmother     Leukemia Maternal Grandmother     Coronary Artery Disease (Blocked arteries around heart) Maternal Grandfather Maryclaire        N   Cancer Maternal Grandfather Floyce Stakes   Lung cancer Paternal Grandmother     Coronary Artery Disease (Blocked arteries around heart) Paternal Grandfather  Chrissie Noa 88       N   Diabetes Brother Iantha Fallen   Thyroid disease Sister Monico Blitz   Anesthesia problems Neg Hx      Review of Systems: Noncontributory  PHYSICAL EXAM: Blood pressure 107/70, pulse 81, height 162.6 cm (5\' 4" ), weight 57.2 kg (126 lb), last menstrual period 12/17/2022, not currently breastfeeding. CONSTITUTIONAL: Well-developed, well-nourished female in no acute distress.  HENT:  Normocephalic, atraumatic, External right and left ear normal. Oropharynx is clear and moist EYES: Conjunctivae and EOM are normal. Pupils are equal, round, and reactive to light. No scleral icterus.  NECK: Normal range of motion, supple, no masses SKIN: Skin is warm and dry. No rash noted. Not diaphoretic. No erythema. No pallor. NEUROLGIC: Alert and oriented to person, place, and time. Normal reflexes, muscle tone coordination. No cranial nerve deficit noted. PSYCHIATRIC: Normal mood and affect. Normal behavior. Normal judgment and thought content. CARDIOVASCULAR: Normal heart rate noted, regular rhythm RESPIRATORY: Effort and breath sounds normal, no problems with respiration noted ABDOMEN: Soft, nontender, nondistended. PELVIC: Deferred MUSCULOSKELETAL: Normal range of motion. No edema and no tenderness. 2+ distal pulses.  Labs: No results found for this or any previous visit (from the past 2 weeks).  Imaging Studies: No results found.  Assessment: 1. Endometriosis determined by laparoscopy   2. Chronic pelvic pain in female   3. Menorrhagia with regular cycle   4. Dysmenorrhea      Plan: Patient will undergo surgical management with the above-noted surgery.   The risks of surgery were discussed in detail with the patient including but not limited to: bleeding which may require transfusion or reoperation; infection which may require antibiotics; injury to surrounding organs which may involve bowel, bladder, ureters ; need for additional procedures including laparoscopy or  laparotomy; thromboembolic phenomenon, surgical site problems and other postoperative/anesthesia complications. Likelihood of success in alleviating the patient's condition was discussed. Routine postoperative instructions will be reviewed with the patient and her family in detail after surgery.  The patient concurred with the proposed plan, giving  informed written consent for the surgery.   Preoperative prophylactic antibiotics, as indicated, and SCDs ordered on call to the OR.    Return in 22 days (on 01/29/2023) for Post-op incision check, Keep previously scheduled appts.   Attestation Statement:   I personally performed the service. (TP)  Emmalina Espericueta Teola Bradley, MD  [facility] 01/07/2023 4:03 PM

## 2023-01-14 ENCOUNTER — Encounter
Admission: RE | Admit: 2023-01-14 | Discharge: 2023-01-14 | Disposition: A | Discharge status: Home / Self Care | Payer: BC Managed Care – PPO | Source: Ambulatory Visit | Attending: Obstetrics and Gynecology | Admitting: Obstetrics and Gynecology

## 2023-01-14 ENCOUNTER — Other Ambulatory Visit: Payer: Self-pay

## 2023-01-14 VITALS — Ht 64.0 in | Wt 126.0 lb

## 2023-01-14 DIAGNOSIS — Z01812 Encounter for preprocedural laboratory examination: Secondary | ICD-10-CM

## 2023-01-14 NOTE — Patient Instructions (Addendum)
Your procedure is scheduled on: Wednesday 01/22/23 Report to the Registration Desk on the 1st floor of the [facility]. To find out your arrival time, please call [phone] between 1PM - 3PM on: Tuesday 01/21/2023. If your arrival time is 6:00 am, do not arrive before that time as the [facility] entrance doors do not open until 6:00 am.  REMEMBER: Instructions that are not followed completely may result in serious medical risk, up to and including death; or upon the discretion of your surgeon and anesthesiologist your surgery may need to be rescheduled.  Do not eat food or drink fluids after midnight the night before surgery.  No gum chewing or hard candies.   One week prior to surgery: Stop Anti-inflammatories (NSAIDS) such as Advil, Aleve, Ibuprofen, Motrin, Naproxen, Naprosyn and Aspirin based products such as Excedrin, Goody's Powder, BC Powder. Stop ANY OVER THE COUNTER supplements until after surgery.  You may however, continue to take Tylenol if needed for pain up until the day of surgery.   Continue taking all of your other prescription medications up until the day of surgery.  ON THE DAY OF SURGERY ONLY TAKE THESE MEDICATIONS WITH SIPS OF WATER:  None    No Alcohol for 24 hours before or after surgery.  No Smoking including e-cigarettes for 24 hours before surgery.  No chewable tobacco products for at least 6 hours before surgery.  No nicotine patches on the day of surgery.  Do not use any "recreational" drugs for at least a week (preferably 2 weeks) before your surgery.  Please be advised that the combination of cocaine and anesthesia may have negative outcomes, up to and including death. If you test positive for cocaine, your surgery will be cancelled.  On the morning of surgery brush your teeth with toothpaste and water, you may rinse your mouth with mouthwash if you wish. Do not swallow any toothpaste or mouthwash.  Use CHG Soap or wipes as directed on  instruction sheet.  Do not wear jewelry, make-up, hairpins, clips or nail polish.  For welded (permanent) jewelry: bracelets, anklets, waist bands, etc.  Please have this removed prior to surgery.  If it is not removed, there is a chance that hospital personnel will need to cut it off on the day of surgery.  Do not wear lotions, powders, or perfumes.   Do not shave body hair from the neck down 48 hours before surgery.  Contact lenses, hearing aids and dentures may not be worn into surgery.  Do not bring valuables to the hospital. [facility] is not responsible for any missing/lost belongings or valuables.   Notify your doctor if there is any change in your medical condition (cold, fever, infection).  Wear comfortable clothing (specific to your surgery type) to the hospital.  After surgery, you can help prevent lung complications by doing breathing exercises.  Take deep breaths and cough every 1-2 hours. Your doctor may order a device called an Incentive Spirometer to help you take deep breaths. When coughing or sneezing, hold a pillow firmly against your incision with both hands. This is called "splinting." Doing this helps protect your incision. It also decreases belly discomfort.  If you are being admitted to the hospital overnight, leave your suitcase in the car. After surgery it may be brought to your room.  In case of increased patient census, it may be necessary for you, the patient, to continue your postoperative care in the Same Day Surgery department.  If you are being  discharged the day of surgery, you will not be allowed to drive home. You will need a responsible individual to drive you home and stay with you for 24 hours after surgery.   If you are taking public transportation, you will need to have a responsible individual with you.  Please call the Pre-admissions Testing Dept. at [phone] if you have any questions about these instructions.  Surgery Visitation  Policy:  Patients having surgery or a procedure may have two visitors.  Children under the age of 22 must have an adult with them who is not the patient.  Inpatient Visitation:    Visiting hours are 7 a.m. to 8 p.m. Up to four visitors are allowed at one time in a patient room. The visitors may rotate out with other people during the day.  One visitor age 49 or older may stay with the patient overnight and must be in the room by 8 p.m.     Preparing for Surgery with CHLORHEXIDINE GLUCONATE (CHG) Soap  Chlorhexidine Gluconate (CHG) Soap  o An antiseptic cleaner that kills germs and bonds with the skin to continue killing germs even after washing  o Used for showering the night before surgery and morning of surgery  Before surgery, you can play an important role by reducing the number of germs on your skin.  CHG (Chlorhexidine gluconate) soap is an antiseptic cleanser which kills germs and bonds with the skin to continue killing germs even after washing.  Please do not use if you have an allergy to CHG or antibacterial soaps. If your skin becomes reddened/irritated stop using the CHG.  1. Shower the NIGHT BEFORE SURGERY and the MORNING OF SURGERY with CHG soap.  2. If you choose to wash your hair, wash your hair first as usual with your normal shampoo.  3. After shampooing, rinse your hair and body thoroughly to remove the shampoo.  4. Use CHG as you would any other liquid soap. You can apply CHG directly to the skin and wash gently with a scrungie or a clean washcloth.  5. Apply the CHG soap to your body only from the neck down. Do not use on open wounds or open sores. Avoid contact with your eyes, ears, mouth, and genitals (private parts). Wash face and genitals (private parts) with your normal soap.  6. Wash thoroughly, paying special attention to the area where your surgery will be performed.  7. Thoroughly rinse your body with warm water.  8. Do not shower/wash with your  normal soap after using and rinsing off the CHG soap.  9. Pat yourself dry with a clean towel.  10. Wear clean pajamas to bed the night before surgery.  12. Place clean sheets on your bed the night of your first shower and do not sleep with pets.  13. Shower again with the CHG soap on the day of surgery prior to arriving at the hospital.  14. Do not apply any deodorants/lotions/powders.  15. Please wear clean clothes to the hospital.

## 2023-01-16 ENCOUNTER — Encounter
Admission: RE | Admit: 2023-01-16 | Discharge: 2023-01-16 | Disposition: A | Discharge status: Home / Self Care | Payer: BC Managed Care – PPO | Source: Ambulatory Visit | Attending: Obstetrics and Gynecology | Admitting: Obstetrics and Gynecology

## 2023-01-16 DIAGNOSIS — R102 Pelvic and perineal pain: Secondary | ICD-10-CM | POA: Diagnosis not present

## 2023-01-16 DIAGNOSIS — N946 Dysmenorrhea, unspecified: Secondary | ICD-10-CM | POA: Diagnosis not present

## 2023-01-16 DIAGNOSIS — Z01812 Encounter for preprocedural laboratory examination: Secondary | ICD-10-CM | POA: Diagnosis present

## 2023-01-16 DIAGNOSIS — N920 Excessive and frequent menstruation with regular cycle: Secondary | ICD-10-CM | POA: Diagnosis not present

## 2023-01-16 DIAGNOSIS — N809 Endometriosis, unspecified: Secondary | ICD-10-CM | POA: Diagnosis not present

## 2023-01-16 DIAGNOSIS — G8929 Other chronic pain: Secondary | ICD-10-CM | POA: Diagnosis not present

## 2023-01-16 LAB — CBC
HCT: 38.5 % (ref 36.0–46.0)
Hemoglobin: 12.9 g/dL (ref 12.0–15.0)
MCH: 31.4 pg (ref 26.0–34.0)
MCHC: 33.5 g/dL (ref 30.0–36.0)
MCV: 93.7 fL (ref 80.0–100.0)
Platelets: 285 10*3/uL (ref 150–400)
RBC: 4.11 MIL/uL (ref 3.87–5.11)
RDW: 12.9 % (ref 11.5–15.5)
WBC: 5.3 10*3/uL (ref 4.0–10.5)
nRBC: 0 % (ref 0.0–0.2)

## 2023-01-16 LAB — TYPE AND SCREEN
ABO/RH(D): O POS
Antibody Screen: NEGATIVE

## 2023-01-21 MED ORDER — SODIUM CHLORIDE 0.9 % IV SOLN: INTRAVENOUS | Status: AC

## 2023-01-21 MED ORDER — CEFAZOLIN SODIUM-DEXTROSE 2-4 GM/100ML-% IV SOLN
2.0000 g | INTRAVENOUS | Status: AC
Start: 2023-01-22 — End: 2023-01-23
  Administered 2023-01-22: 2 g via INTRAVENOUS

## 2023-01-21 MED ORDER — LACTATED RINGERS IV SOLN: INTRAVENOUS | Status: DC

## 2023-01-21 MED ORDER — CHLORHEXIDINE GLUCONATE 0.12 % MT SOLN
15.0000 mL | Freq: Once | OROMUCOSAL | Status: AC
  Administered 2023-01-22: 15 mL via OROMUCOSAL

## 2023-01-21 MED ORDER — POVIDONE-IODINE 10 % EX SWAB
2.0000 | Freq: Once | CUTANEOUS | Status: AC
  Administered 2023-01-22: 2 via TOPICAL

## 2023-01-21 MED ORDER — SOD CITRATE-CITRIC ACID 500-334 MG/5ML PO SOLN
30.0000 mL | ORAL | Status: DC
  Filled 2023-01-21: qty 30

## 2023-01-21 MED ORDER — ORAL CARE MOUTH RINSE: 15.0000 mL | Freq: Once | OROMUCOSAL | Status: AC

## 2023-01-22 ENCOUNTER — Ambulatory Visit
Admission: RE | Admit: 2023-01-22 | Discharge: 2023-01-22 | Disposition: A | Discharge status: Home / Self Care | Payer: BC Managed Care – PPO | Attending: Obstetrics and Gynecology | Admitting: Obstetrics and Gynecology

## 2023-01-22 ENCOUNTER — Ambulatory Visit: Payer: BC Managed Care – PPO | Admitting: Anesthesiology

## 2023-01-22 ENCOUNTER — Ambulatory Visit: Payer: BC Managed Care – PPO | Admitting: Urgent Care

## 2023-01-22 ENCOUNTER — Other Ambulatory Visit: Payer: Self-pay

## 2023-01-22 ENCOUNTER — Encounter
Admission: RE | Disposition: A | Discharge status: Home / Self Care | Payer: Self-pay | Source: Home / Self Care | Attending: Obstetrics and Gynecology

## 2023-01-22 ENCOUNTER — Encounter: Payer: Self-pay | Admitting: Obstetrics and Gynecology

## 2023-01-22 DIAGNOSIS — Z01812 Encounter for preprocedural laboratory examination: Secondary | ICD-10-CM

## 2023-01-22 DIAGNOSIS — G8929 Other chronic pain: Secondary | ICD-10-CM | POA: Diagnosis present

## 2023-01-22 DIAGNOSIS — N80319 Endometriosis of the anterior cul-de-sac, unspecified depth: Secondary | ICD-10-CM | POA: Diagnosis not present

## 2023-01-22 DIAGNOSIS — N8003 Adenomyosis of the uterus: Secondary | ICD-10-CM | POA: Insufficient documentation

## 2023-01-22 DIAGNOSIS — R102 Pelvic and perineal pain unspecified side: Secondary | ICD-10-CM | POA: Diagnosis present

## 2023-01-22 DIAGNOSIS — N920 Excessive and frequent menstruation with regular cycle: Secondary | ICD-10-CM | POA: Diagnosis not present

## 2023-01-22 DIAGNOSIS — N809 Endometriosis, unspecified: Secondary | ICD-10-CM | POA: Diagnosis present

## 2023-01-22 DIAGNOSIS — N946 Dysmenorrhea, unspecified: Secondary | ICD-10-CM | POA: Diagnosis present

## 2023-01-22 HISTORY — PX: ROBOTIC ASSISTED TOTAL HYSTERECTOMY WITH BILATERAL SALPINGO OOPHERECTOMY: SHX6086

## 2023-01-22 LAB — POCT PREGNANCY, URINE: Preg Test, Ur: NEGATIVE

## 2023-01-22 SURGERY — HYSTERECTOMY, TOTAL, ROBOT-ASSISTED, LAPAROSCOPIC, WITH BILATERAL SALPINGO-OOPHORECTOMY
Anesthesia: General | Site: Abdomen | Laterality: Bilateral

## 2023-01-22 MED ORDER — OXYCODONE HCL 5 MG/5ML PO SOLN
5.0000 mg | Freq: Once | ORAL | Status: DC | PRN
Start: 2023-01-22 — End: 2023-01-22

## 2023-01-22 MED ORDER — ACETAMINOPHEN 10 MG/ML IV SOLN
INTRAVENOUS | Status: DC | PRN
  Administered 2023-01-22: 1000 mg via INTRAVENOUS

## 2023-01-22 MED ORDER — HYDROMORPHONE HCL 1 MG/ML IJ SOLN
INTRAMUSCULAR | Status: AC
  Filled 2023-01-22: qty 1

## 2023-01-22 MED ORDER — CHLORHEXIDINE GLUCONATE 0.12 % MT SOLN
OROMUCOSAL | Status: AC
  Filled 2023-01-22: qty 15

## 2023-01-22 MED ORDER — MIDAZOLAM HCL 2 MG/2ML IJ SOLN
INTRAMUSCULAR | Status: DC | PRN
  Administered 2023-01-22: 2 mg via INTRAVENOUS

## 2023-01-22 MED ORDER — BUPIVACAINE HCL (PF) 0.5 % IJ SOLN
INTRAMUSCULAR | Status: AC
  Filled 2023-01-22: qty 30

## 2023-01-22 MED ORDER — OXYCODONE-ACETAMINOPHEN 5-325 MG PO TABS: 1.0000 | ORAL_TABLET | Freq: Four times a day (QID) | ORAL | 0 refills | Status: AC | PRN

## 2023-01-22 MED ORDER — FENTANYL CITRATE (PF) 100 MCG/2ML IJ SOLN
INTRAMUSCULAR | Status: DC | PRN
  Administered 2023-01-22 (×2): 25 ug via INTRAVENOUS
  Administered 2023-01-22: 150 ug via INTRAVENOUS

## 2023-01-22 MED ORDER — FENTANYL CITRATE (PF) 100 MCG/2ML IJ SOLN
INTRAMUSCULAR | Status: AC
  Filled 2023-01-22: qty 2

## 2023-01-22 MED ORDER — DEXAMETHASONE SODIUM PHOSPHATE 10 MG/ML IJ SOLN
INTRAMUSCULAR | Status: DC | PRN
  Administered 2023-01-22: 8 mg via INTRAVENOUS

## 2023-01-22 MED ORDER — ONDANSETRON HCL 4 MG/2ML IJ SOLN
INTRAMUSCULAR | Status: DC | PRN
  Administered 2023-01-22: 4 mg via INTRAVENOUS

## 2023-01-22 MED ORDER — IBUPROFEN 600 MG PO TABS: 600.0000 mg | ORAL_TABLET | Freq: Four times a day (QID) | ORAL | 0 refills | Status: AC

## 2023-01-22 MED ORDER — KETOROLAC TROMETHAMINE 30 MG/ML IJ SOLN
INTRAMUSCULAR | Status: DC | PRN
  Administered 2023-01-22: 30 mg via INTRAVENOUS

## 2023-01-22 MED ORDER — HYDROMORPHONE HCL 1 MG/ML IJ SOLN
0.2500 mg | INTRAMUSCULAR | Status: DC | PRN
  Administered 2023-01-22 (×2): 0.5 mg via INTRAVENOUS

## 2023-01-22 MED ORDER — ACETAMINOPHEN 10 MG/ML IV SOLN
INTRAVENOUS | Status: AC
  Filled 2023-01-22: qty 100

## 2023-01-22 MED ORDER — ONDANSETRON 4 MG PO TBDP: 4.0000 mg | ORAL_TABLET | Freq: Four times a day (QID) | ORAL | 0 refills | Status: AC | PRN

## 2023-01-22 MED ORDER — DEXAMETHASONE SODIUM PHOSPHATE 10 MG/ML IJ SOLN
INTRAMUSCULAR | Status: AC
  Filled 2023-01-22: qty 1

## 2023-01-22 MED ORDER — MIDAZOLAM HCL 2 MG/2ML IJ SOLN
INTRAMUSCULAR | Status: AC
  Filled 2023-01-22: qty 2

## 2023-01-22 MED ORDER — BUPIVACAINE HCL 0.5 % IJ SOLN
INTRAMUSCULAR | Status: DC | PRN
  Administered 2023-01-22: 5 mL

## 2023-01-22 MED ORDER — PROPOFOL 10 MG/ML IV BOLUS
INTRAVENOUS | Status: DC | PRN
  Administered 2023-01-22: 150 mg via INTRAVENOUS

## 2023-01-22 MED ORDER — ONDANSETRON HCL 4 MG/2ML IJ SOLN
INTRAMUSCULAR | Status: AC
  Filled 2023-01-22: qty 2

## 2023-01-22 MED ORDER — PROPOFOL 10 MG/ML IV BOLUS: INTRAVENOUS | Status: DC | PRN

## 2023-01-22 MED ORDER — CEFAZOLIN SODIUM-DEXTROSE 2-4 GM/100ML-% IV SOLN
INTRAVENOUS | Status: AC
  Filled 2023-01-22: qty 100

## 2023-01-22 MED ORDER — OXYCODONE HCL 5 MG PO TABS: 5.0000 mg | ORAL_TABLET | Freq: Once | ORAL | Status: DC | PRN

## 2023-01-22 MED ORDER — KETOROLAC TROMETHAMINE 30 MG/ML IJ SOLN
INTRAMUSCULAR | Status: AC
  Filled 2023-01-22: qty 1

## 2023-01-22 MED ORDER — ROCURONIUM BROMIDE 100 MG/10ML IV SOLN
INTRAVENOUS | Status: DC | PRN
  Administered 2023-01-22: 30 mg via INTRAVENOUS
  Administered 2023-01-22 (×2): 10 mg via INTRAVENOUS

## 2023-01-22 MED ORDER — 0.9 % SODIUM CHLORIDE (POUR BTL) OPTIME
TOPICAL | Status: DC | PRN
  Administered 2023-01-22: 3 mL

## 2023-01-22 MED ORDER — ROCURONIUM BROMIDE 10 MG/ML (PF) SYRINGE
PREFILLED_SYRINGE | INTRAVENOUS | Status: AC
  Filled 2023-01-22: qty 10

## 2023-01-22 MED ORDER — LIDOCAINE HCL (CARDIAC) PF 100 MG/5ML IV SOSY
PREFILLED_SYRINGE | INTRAVENOUS | Status: DC | PRN
  Administered 2023-01-22: 60 mg via INTRAVENOUS

## 2023-01-22 MED ORDER — LIDOCAINE HCL (PF) 2 % IJ SOLN
INTRAMUSCULAR | Status: AC
  Filled 2023-01-22: qty 5

## 2023-01-22 SURGICAL SUPPLY — 78 items
ADH SKN CLS APL DERMABOND .7 (GAUZE/BANDAGES/DRESSINGS) ×3
BAG DRN RND TRDRP ANRFLXCHMBR (UROLOGICAL SUPPLIES) ×3
BAG URINE DRAIN 2000ML AR STRL (UROLOGICAL SUPPLIES) ×3 IMPLANT
BASIN KIT SINGLE STR (MISCELLANEOUS) ×3 IMPLANT
BLADE SURG 15 STRL LF DISP TIS (BLADE) ×3 IMPLANT
BLADE SURG 15 STRL SS (BLADE) ×3
BLADE SURG SZ11 CARB STEEL (BLADE) ×3 IMPLANT
CANNULA CAP OBTURATR AIRSEAL 8 (CAP) ×3 IMPLANT
CATH FOLEY 2WAY 5CC 16FR (CATHETERS) ×3
CATH URTH 16FR FL 2W BLN LF (CATHETERS) ×3 IMPLANT
COVER TIP SHEARS 8 DVNC (MISCELLANEOUS) ×3 IMPLANT
DERMABOND ADVANCED .7 DNX12 (GAUZE/BANDAGES/DRESSINGS) ×3 IMPLANT
DRAPE ARM DVNC X/XI (DISPOSABLE) ×12 IMPLANT
DRAPE COLUMN DVNC XI (DISPOSABLE) ×3 IMPLANT
DRAPE ROBOT W/ LEGGING 30X125 (DRAPES) ×3 IMPLANT
DRAPE SHEET LG 3/4 BI-LAMINATE (DRAPES) IMPLANT
DRAPE UNDER BUTTOCK W/FLU (DRAPES) ×3 IMPLANT
DRIVER NDL MEGA SUTCUT DVNCXI (INSTRUMENTS) ×2 IMPLANT
DRIVER NDLE MEGA SUTCUT DVNCXI (INSTRUMENTS) ×3
ELECT REM PT RETURN 9FT ADLT (ELECTROSURGICAL) ×3
ELECTRODE REM PT RTRN 9FT ADLT (ELECTROSURGICAL) ×2 IMPLANT
FORCEPS BPLR FENES DVNC XI (FORCEP) ×3 IMPLANT
FORCEPS BPLR R/ABLATION 8 DVNC (INSTRUMENTS) ×3 IMPLANT
GAUZE 4X4 16PLY ~~LOC~~+RFID DBL (SPONGE) ×3 IMPLANT
GLOVE BIO SURGEON STRL SZ7 (GLOVE) ×9 IMPLANT
GLOVE BIOGEL PI IND STRL 7.5 (GLOVE) ×9 IMPLANT
GOWN STRL REUS W/ TWL LRG LVL3 (GOWN DISPOSABLE) ×9 IMPLANT
GOWN STRL REUS W/ TWL XL LVL3 (GOWN DISPOSABLE) ×3 IMPLANT
GOWN STRL REUS W/TWL LRG LVL3 (GOWN DISPOSABLE) ×9
GOWN STRL REUS W/TWL XL LVL3 (GOWN DISPOSABLE) ×3
IRRIGATION STRYKERFLOW (MISCELLANEOUS) IMPLANT
IRRIGATOR STRYKERFLOW (MISCELLANEOUS) ×3
IRRIGATOR SUCT 8 DISP DVNC XI (IRRIGATION / IRRIGATOR) IMPLANT
IV LACTATED RINGERS 1000ML (IV SOLUTION) ×3 IMPLANT
IV NS 1000ML (IV SOLUTION) ×3
IV NS 1000ML BAXH (IV SOLUTION) ×3 IMPLANT
KIT PINK PAD W/HEAD ARE REST (MISCELLANEOUS) ×3
KIT PINK PAD W/HEAD ARM REST (MISCELLANEOUS) ×2 IMPLANT
KIT TURNOVER CYSTO (KITS) ×3 IMPLANT
LABEL OR SOLS (LABEL) ×3 IMPLANT
MANIFOLD NEPTUNE II (INSTRUMENTS) ×3 IMPLANT
MANIPULATOR VCARE LG CRV RETR (MISCELLANEOUS) ×1 IMPLANT
MANIPULATOR VCARE SML CRV RETR (MISCELLANEOUS) IMPLANT
MANIPULATOR VCARE STD CRV RETR (MISCELLANEOUS) IMPLANT
NDL HYPO 22X1.5 SAFETY MO (MISCELLANEOUS) ×2 IMPLANT
NEEDLE HYPO 22X1.5 SAFETY MO (MISCELLANEOUS) ×3
NS IRRIG 500ML POUR BTL (IV SOLUTION) ×3 IMPLANT
OBTURATOR OPTICAL STND 8 DVNC (TROCAR) ×3
OBTURATOR OPTICALSTD 8 DVNC (TROCAR) ×2 IMPLANT
OCCLUDER COLPOPNEUMO (BALLOONS) ×3 IMPLANT
PACK LAP CHOLECYSTECTOMY (MISCELLANEOUS) ×3 IMPLANT
PAD PREP OB/GYN DISP 24X41 (PERSONAL CARE ITEMS) ×3 IMPLANT
PENCIL SMOKE EVACUATOR COATED (MISCELLANEOUS) ×1 IMPLANT
SCISSORS MNPLR CVD DVNC XI (INSTRUMENTS) ×3 IMPLANT
SCRUB CHG 4% DYNA-HEX 4OZ (MISCELLANEOUS) ×3 IMPLANT
SEAL UNIV 5-12 XI (MISCELLANEOUS) ×9 IMPLANT
SEALER VESSEL EXT DVNC XI (MISCELLANEOUS) IMPLANT
SET CYSTO W/LG BORE CLAMP LF (SET/KITS/TRAYS/PACK) ×3 IMPLANT
SET TUBE FILTERED XL AIRSEAL (SET/KITS/TRAYS/PACK) ×3 IMPLANT
SOL ELECTROSURG ANTI STICK (MISCELLANEOUS) ×3
SOLUTION ELECTROSURG ANTI STCK (MISCELLANEOUS) ×2 IMPLANT
SPONGE T-LAP 18X18 ~~LOC~~+RFID (SPONGE) IMPLANT
SURGILUBE 2OZ TUBE FLIPTOP (MISCELLANEOUS) ×3 IMPLANT
SUT MNCRL 4-0 (SUTURE) ×3
SUT MNCRL 4-0 27XMFL (SUTURE) ×3
SUT STRATA 2-0 23CM CT-2 (SUTURE) ×3 IMPLANT
SUT STRATA PDS 0 30 CT-2.5 (SUTURE) ×2 IMPLANT
SUT STRATAFIX 0 PDS+ CT-2 23 (SUTURE) ×3
SUT STRATAFIX SPIRAL PDS+ 0 30 (SUTURE) IMPLANT
SUT VIC AB 0 CT2 27 (SUTURE) ×2 IMPLANT
SUT VLOC 180 0 6IN GS21 (SUTURE) ×1 IMPLANT
SUTURE MNCRL 4-0 27XMF (SUTURE) ×2 IMPLANT
SUTURE STRATFX 0 PDS+ CT-2 23 (SUTURE) IMPLANT
SYR 10ML LL (SYRINGE) ×3 IMPLANT
SYR 50ML LL SCALE MARK (SYRINGE) ×3 IMPLANT
SYR BULB EAR ULCER 3OZ GRN STR (SYRINGE) ×1 IMPLANT
TRAP FLUID SMOKE EVACUATOR (MISCELLANEOUS) ×3 IMPLANT
WATER STERILE IRR 500ML POUR (IV SOLUTION) ×3 IMPLANT

## 2023-01-22 NOTE — Op Note (Signed)
Operative Note    Name: Candice Mccall  Date of Service: 01/22/2023   DOB: Jan 25, 1981  MRN: [id number]    Pre-Operative Diagnosis:  1) Endometriosis  2) Chronic pelvic pain  3) Menorrhagia with regular cycle 4) Dysmenorrhea  Post-Operative Diagnosis:  1) Endometriosis  2) Chronic pelvic pain  3) Menorrhagia with regular cycle 4) Dysmenorrhea  Procedures:  1. Robot assisted Total Laparoscopic Hysterectomy, bilateral salpingo-oophorectomy 2. Excision of endometriosis  Primary Surgeon: Thomasene Mohair, MD  Assistant Surgeon: Christeen Douglas, MD   EBL: 15 mL   IVF: 850 mL   Urine output: 250 mL, clear urine at the end of the case  Specimens:  1) Uterus, cervix, and bilateral fallopian tubes and ovaries 2) Right anterior cul-de-sac excision of endometriosis 3) Left anterior cul-de-sac excision of endometriosis  Drains: none  Complications: None   Disposition: PACU   Condition: Stable   Findings:  1) Mildly enlarged uterus with normal appearing bilateral fallopian tubes and ovaries 2) Bilateral endometriosis implants anteriorly just lateral to the bladder 3) Posterior cervix endometriosis implants  Procedure Summary:  The patient was taken to the operating room where general anesthesia was administered and found to be adequate. She was placed in the dorsal supine lithotomy position in [place] stirrups and prepped and draped in the usual, sterile fashion. After a timeout was called an indwelling catheter was placed in her bladder.  A sterile speculum was placed in her vagina.  The anterior lip of the cervix was grasped with the single-tooth tenaculum.  The cervix was serially dilated to an 11 Pratt dilator.  The large Vcare device was placed in accordance to the manufacturer's recommendations.  The tenaculum and speculum were removed.   Attention was turned to the abdomen where after injection of local anesthetic, an 8 mm infraumbilical incision was made with the scalpel.  Entry into the abdomen was obtained via Optiview trocar technique (a blunt entry technique with camera visualization through the obturator upon entry). Verification of entry into the abdomen was obtained using opening pressures. The abdomen was insufflated with CO2. The camera was introduced through the trocar with verification of atraumatic entry.  Right and left abdominal entry sites were created after injection of local anesthetic about 8 cm lateral to the umbilical port in accordance with the Intuitive manufacturer's recommendations.  The intuitive trochars were introduced under intra-abdominal camera visualization without difficulty   The XI robot was docked on the patient's left.  Clearance was verified from the patient's legs.  Through the umbilical port the camera was placed.  Through the port attached to arm 4 the monopolar scissors were placed.  Through the port attached to arm 2 the forced bipolar forceps were was placed.     An inspection was undertaken of the pelvis with the above-noted findings. The bilateral ureters were identified and found to be well away from the operative area of interest.   Attention was first turned to the right anterior cul-de-sac area where several powder burn lesions were noted.  The peritoneum was tented up using the graspers and the peritoneum was entered sharply with the scissors.  A circumferential excision of peritoneum was performed with great care to dissect the underlying tissue away.  Hemostasis was noted at this site.  The specimen was passed off to the bedside assistant.  The same procedure was carried out on the left anterior cul-de-sac lesions.  Again the specimen was passed off to the bedside assistant.  The bladder flap was created and dissected  without difficulty.  The right round ligament was cauterized and transected on the right. The broad ligament was opened anteriorly and parallel to the infundibulopelvic ligament and then anteriorly to the level  of the internal cervical os.  After verifying the ureter was well away, the posterior broad ligament was opened and extended to skeletonized the infundibulopelvic ligament.  Bipolar electrocautery was utilized to fully cauterize the vasculature.  The infundibulopelvic ligament was transected using the scissors.  No bleeding was noted from the infundibulopelvic ligament.  The posterior leaf of the broad ligament was transected to the level of the internal cervical os.  Connective tissue was dissected until the uterine artery was skeletonized.  This was cauterized and transected and divided laterally.  The same procedure was carried out on the left side, again with great care to ensure that the ureter was well away from any transection sites.  The colpotomy was performed using monopolar electrocautery in a circumferential fashion following the KOH ring.  The uterus, cervix, fallopian tubes and ovaries were removed through the vagina.   Closure of the vaginal cuff was undertaken using the V-lock stitch in a running fashion.  All vascular pedicles were inspected and found to be hemostatic after dropping the intra-abdominal pressure to 5 mmHg.  All instruments removed from the robotic ports.  The robot was undocked from the patient.  The abdomen was then desufflated of CO2 with the aid of 5 deep breaths from anesthesia.  All trochars were then removed.  All skin incisions were closed using 4-0 Vicryl in a subcuticular fashion and reinforced using surgical skin glue.   The Foley catheter was removed.  The sterile speculum was replaced in the vagina to verify hemostasis along the vaginal cuff.  No instruments or sponges were visible.  The speculum was removed.  A digital sweep of the vagina was undertaken to ensure no instruments or sponges remained and this was verified to be clear.  The assistant performed placement of the uterine manipulator, creation of port sites, and removal of the specimen.  The patient  tolerated the procedure well.  Sponge, lap, needle, and instrument counts were correct x 2.  VTE prophylaxis: SCDs. Antibiotic prophylaxis: Ancef 2 grams IV prior to skin incision.  The assistant was an MD, as no other qualified assistant was available.  She was awakened in the operating room and was taken to the PACU in stable condition.   Thomasene Mohair, MD, [facility] 01/22/2023 4:17 PM

## 2023-01-22 NOTE — Anesthesia Procedure Notes (Signed)
Procedure Name: Intubation Date/Time: 01/22/2023 1:41 PM  Performed by: Rich Brave, CRNAPre-anesthesia Checklist: Patient identified, Emergency Drugs available, Suction available, Patient being monitored and Timeout performed Patient Re-evaluated:Patient Re-evaluated prior to induction Oxygen Delivery Method: Circle system utilized Preoxygenation: Pre-oxygenation with 100% oxygen Induction Type: IV induction Ventilation: Mask ventilation without difficulty Laryngoscope Size: McGraph Grade View: Grade I Tube type: Oral Tube size: 6.5 mm Number of attempts: 1 Airway Equipment and Method: Stylet and Video-laryngoscopy Placement Confirmation: ETT inserted through vocal cords under direct vision, positive ETCO2, CO2 detector and breath sounds checked- equal and bilateral Secured at: 20 cm Tube secured with: Tape Dental Injury: Teeth and Oropharynx as per pre-operative assessment

## 2023-01-22 NOTE — Anesthesia Postprocedure Evaluation (Signed)
Anesthesia Post Note  Patient: Candice Mccall  Procedure(s) Performed: XI ROBOTIC ASSISTED TOTAL HYSTERECTOMY WITH BILATERAL SALPINGO OOPHORECTOMY (Bilateral: Abdomen) LAPAROSCOPIC EXCISION OF ENDOMETRIOSIS  Patient location during evaluation: PACU Anesthesia Type: General Level of consciousness: awake and alert Pain management: pain level controlled Vital Signs Assessment: post-procedure vital signs reviewed and stable Respiratory status: spontaneous breathing, nonlabored ventilation, respiratory function stable and patient connected to nasal cannula oxygen Cardiovascular status: blood pressure returned to baseline and stable Postop Assessment: no apparent nausea or vomiting Anesthetic complications: no  No notable events documented.   Last Vitals:  Vitals:   01/22/23 1733 01/22/23 1825  BP: 110/72 111/68  Pulse: 60 63  Resp: (!) 22 20  Temp: 36.5 C   SpO2: 96% 98%    Last Pain:  Vitals:   01/22/23 1733  TempSrc: Temporal  PainSc: 3                  Stephanie Coup

## 2023-01-22 NOTE — Transfer of Care (Addendum)
Immediate Anesthesia Transfer of Care Note  Patient: Clarisse Gouge  Procedure(s) Performed: XI ROBOTIC ASSISTED TOTAL HYSTERECTOMY WITH BILATERAL SALPINGO OOPHORECTOMY (Bilateral: Abdomen) LAPAROSCOPIC EXCISION OF ENDOMETRIOSIS  Patient Location: PACU  Anesthesia Type:General  Level of Consciousness: awake and alert   Airway & Oxygen Therapy: Patient Spontanous Breathing and Patient connected to face mask oxygen  Post-op Assessment: Report given to RN, Post -op Vital signs reviewed and stable, and Patient moving all extremities X 4  Post vital signs: Reviewed and stable  Last Vitals:  Vitals Value Taken Time  BP 104/73 01/22/23 1700  Temp 36.4 C 01/22/23 1638  Pulse 69 01/22/23 1702  Resp 12 01/22/23 1702  SpO2 100 % 01/22/23 1702  Vitals shown include unfiled device data.  Last Pain:  Vitals:   01/22/23 1700  PainSc: 6          Complications: No notable events documented.

## 2023-01-22 NOTE — Anesthesia Preprocedure Evaluation (Signed)
Anesthesia Evaluation  Patient identified by MRN, date of birth, ID band Patient awake    Reviewed: Allergy & Precautions, NPO status , Patient's Chart, lab work & pertinent test results  History of Anesthesia Complications Negative for: history of anesthetic complications  Airway Mallampati: I  TM Distance: >3 FB Neck ROM: full    Dental no notable dental hx.    Pulmonary neg pulmonary ROS   Pulmonary exam normal        Cardiovascular negative cardio ROS Normal cardiovascular exam     Neuro/Psych  Headaches PSYCHIATRIC DISORDERS Anxiety     negative neurological ROS     GI/Hepatic negative GI ROS, Neg liver ROS,,,  Endo/Other  negative endocrine ROS    Renal/GU negative Renal ROS     Musculoskeletal   Abdominal   Peds  Hematology negative hematology ROS (+)   Anesthesia Other Findings Past Medical History: No date: Anxiety 2017: Colitis     Comment:  thought abdominal pain was colitis but it is not. No date: Eustachian tube disorder, right No date: Migraine headache     Comment:  "couple times per month"  Past Surgical History: 06/02/2015: CYSTOSCOPY; N/A     Comment:  Procedure: CYSTOSCOPY;  Surgeon: Vena Austria, MD;                Location: [facility];  Service: Gynecology;  Laterality:               N/A; 2014: DIAGNOSTIC LAPAROSCOPY     Comment:  looked like endometriosis 02/07/2015: GIVENS CAPSULE STUDY; N/A     Comment:  Procedure: GIVENS CAPSULE STUDY;  Surgeon: Scot Jun, MD;  Location: [facility];  Service:               Endoscopy;  Laterality: N/A; 02/21/2022: IMAGE GUIDED SINUS SURGERY; Bilateral     Comment:  Procedure: IMAGE GUIDED SINUS SURGERY;  Surgeon:               Vernie Murders, MD;  Location: [facility];                Service: ENT;  Laterality: Bilateral;  PLACED DISK ON OR               CHARGE NURSE DESK 11-09  KP 06/02/2015: LAPAROSCOPY; N/A      Comment:  Procedure: LAPAROSCOPY DIAGNOSTIC;  Surgeon: Vena Austria, MD;  Location: [facility];  Service: Gynecology;               Laterality: N/A; 02/21/2022: MAXILLARY ANTROSTOMY; Bilateral     Comment:  Procedure: MAXILLARY ANTROSTOMY;  Surgeon: Vernie Murders, MD;  Location: [facility];  Service: ENT;               Laterality: Bilateral;  Latex     Reproductive/Obstetrics negative OB ROS                             Anesthesia Physical Anesthesia Plan  ASA: 1  Anesthesia Plan: General ETT   Post-op Pain Management: Ofirmev IV (intra-op)*, Toradol IV (intra-op)* and Ketamine IV*   Induction: Intravenous  PONV Risk Score and Plan: 4 or greater and Ondansetron,  Dexamethasone, Midazolam and Treatment may vary due to age or medical condition  Airway Management Planned: Oral ETT  Additional Equipment:   Intra-op Plan:   Post-operative Plan: Extubation in OR  Informed Consent: I have reviewed the patients History and Physical, chart, labs and discussed the procedure including the risks, benefits and alternatives for the proposed anesthesia with the patient or authorized representative who has indicated his/her understanding and acceptance.     Dental Advisory Given  Plan Discussed with: Anesthesiologist, CRNA and Surgeon  Anesthesia Plan Comments: (Patient consented for risks of anesthesia including but not limited to:  - adverse reactions to medications - damage to eyes, teeth, lips or other oral mucosa - nerve damage due to positioning  - sore throat or hoarseness - Damage to heart, brain, nerves, lungs, other parts of body or loss of life  Patient voiced understanding and assent.)       Anesthesia Quick Evaluation

## 2023-01-22 NOTE — Interval H&P Note (Signed)
History and Physical Interval Note:  01/22/2023 1:12 PM  Candice Mccall  has presented today for surgery, with the diagnosis of endometriosis, chronic pelvic pain, menorrhagia with regular cycles, dysmenorrhea.  The various methods of treatment have been discussed with the patient and family. After consideration of risks, benefits and other options for treatment, the patient has consented to  Procedure(s): XI ROBOTIC ASSISTED TOTAL HYSTERECTOMY WITH BILATERAL SALPINGO OOPHORECTOMY (Bilateral) CYSTOSCOPY (N/A) as a surgical intervention.  The patient's history has been reviewed, patient examined, no change in status, stable for surgery.  I have reviewed the patient's chart and labs.  Questions were answered to the patient's satisfaction.    Thomasene Mohair, MD, [facility] 01/22/2023 1:12 PM

## 2023-01-23 ENCOUNTER — Encounter: Payer: Self-pay | Admitting: Obstetrics and Gynecology

## 2023-01-24 LAB — SURGICAL PATHOLOGY
